# Patient Record
Sex: Male | Born: 1995 | Race: White | Hispanic: No | Marital: Single | Smoking: Former smoker
Health system: Southern US, Community
[De-identification: ages and names within clinical notes are randomized; demographics above are authoritative.]

## PROBLEM LIST (undated history)

## (undated) ENCOUNTER — Emergency Department (HOSPITAL_COMMUNITY): Discharge status: Absent without leave | Payer: BC Managed Care – PPO

## (undated) DIAGNOSIS — K219 Gastro-esophageal reflux disease without esophagitis: Secondary | ICD-10-CM

## (undated) DIAGNOSIS — L718 Other rosacea: Secondary | ICD-10-CM

## (undated) HISTORY — DX: Other rosacea: L71.8

## (undated) HISTORY — PX: TONSILLECTOMY AND ADENOIDECTOMY: SUR1326

## (undated) HISTORY — DX: Gastro-esophageal reflux disease without esophagitis: K21.9

---
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
## 2013-09-25 ENCOUNTER — Ambulatory Visit: Payer: BC Managed Care – PPO | Admitting: Dietician

## 2013-11-05 ENCOUNTER — Encounter: Payer: Self-pay | Admitting: Dietician

## 2013-11-05 ENCOUNTER — Encounter: Payer: BC Managed Care – PPO | Attending: Internal Medicine | Admitting: Dietician

## 2013-11-05 VITALS — Ht 70.0 in | Wt 180.0 lb

## 2013-11-05 DIAGNOSIS — Z713 Dietary counseling and surveillance: Secondary | ICD-10-CM | POA: Insufficient documentation

## 2013-11-05 DIAGNOSIS — R51 Headache: Secondary | ICD-10-CM | POA: Insufficient documentation

## 2013-11-05 NOTE — Progress Notes (Signed)
Medical Nutrition Therapy:  Appt start time: 1600 end time:  1700.  Assessment:  Primary concerns today: Pt currently taking a variety of supplements, interested in bodybuilding, wants info on best weight gain practices. Pt also c/o frequent, severe headache during exercise.  Preferred Learning Style:   No preference indicated   Learning Readiness:   Ready  MEDICATIONS: see list  Pt is currently taking 6 supplements of note: Creatine Protein powder (whey) Blood vessel expander (a combination of arginine, ornithine, citrulline, and beta-alanine) A "testosterone precursor" A "post-cycle therapy" Pre-workout energy drinks  Progress Towards Goal(s):  In progress.   Nutritional Diagnosis:  NB-1.1 Food and nutrition-related knowledge deficit As related to supplement use and industry, effective weight gain protocols.  As evidenced by pt use of multiple supplements with limited efficacy and possible safety concerns, pt questions during session about weight gain diet.    Intervention:  Nutrition counseling regarding safe supplement practice and those with efficacy for performance in the sport of bodybuilding/weight lifting, also effective weight gain diet.   RD recommended pt discontinue the blood vessel expander, hormone precursor, and post cycle therapy supplements due to lack of evidence for efficacy as well as safety concerns. RD recommended use of creatine from the the approved supplement list at 5 g per day, branched chain amino acids at 4-5 g BID from approved supplements list, and protein powder from the approved supplements list as necessary/desired to fit dietary needs. RD recommended 3300 kcal per day diet for weight gain pace of about 1 pound per week, with approximately 1 g protein per pound body weight.  To treat headache, RD recommended pt discontinue use of the valsalva maneuver during weight training (holding breath throughout during of lift), as well as discontinue any pre  workout supplement with caffeine or amphetamine derivatives as both of these will substantially increase intracranial blood pressure. Also, RD recommended Gatorade as a drink option during workouts to prevent low BG and possible mild to moderate hyponatremia. If headaches persist, RD recommended pt see a physician immediately.   Teaching Method Utilized:  Visual Auditory  Handouts given during visit include:  The Athlete's Plate  Supplement Recommendations  Barriers to learning/adherence to lifestyle change: low knowledge level related to bodybuilding culture, supplements  Demonstrated degree of understanding via:  Teach Back   Monitoring/Evaluation:  Dietary intake, exercise, supplement use, and body weight. F/U PRN.

## 2013-11-06 ENCOUNTER — Ambulatory Visit: Payer: BC Managed Care – PPO | Admitting: Dietician

## 2013-11-20 ENCOUNTER — Ambulatory Visit: Payer: BC Managed Care – PPO | Admitting: Dietician

## 2013-11-27 ENCOUNTER — Ambulatory Visit: Payer: BC Managed Care – PPO | Admitting: Dietician

## 2014-05-21 ENCOUNTER — Emergency Department (HOSPITAL_COMMUNITY)
Admission: EM | Admit: 2014-05-21 | Discharge: 2014-05-21 | Disposition: A | Discharge status: Home / Self Care | Payer: BC Managed Care – PPO | Attending: Emergency Medicine | Admitting: Emergency Medicine

## 2014-05-21 ENCOUNTER — Encounter (HOSPITAL_COMMUNITY): Payer: Self-pay | Admitting: *Deleted

## 2014-05-21 DIAGNOSIS — R112 Nausea with vomiting, unspecified: Secondary | ICD-10-CM | POA: Diagnosis present

## 2014-05-21 DIAGNOSIS — Z79899 Other long term (current) drug therapy: Secondary | ICD-10-CM | POA: Insufficient documentation

## 2014-05-21 DIAGNOSIS — R1013 Epigastric pain: Secondary | ICD-10-CM | POA: Insufficient documentation

## 2014-05-21 LAB — COMPREHENSIVE METABOLIC PANEL
ALBUMIN: 4 g/dL (ref 3.5–5.2)
ALK PHOS: 61 U/L (ref 39–117)
ALT: 52 U/L (ref 0–53)
ANION GAP: 13 (ref 5–15)
AST: 98 U/L — ABNORMAL HIGH (ref 0–37)
BILIRUBIN TOTAL: 1 mg/dL (ref 0.3–1.2)
BUN: 15 mg/dL (ref 6–23)
CHLORIDE: 98 meq/L (ref 96–112)
CO2: 28 mEq/L (ref 19–32)
CREATININE: 0.85 mg/dL (ref 0.50–1.35)
Calcium: 9.7 mg/dL (ref 8.4–10.5)
GLUCOSE: 85 mg/dL (ref 70–99)
Potassium: 4.6 mEq/L (ref 3.7–5.3)
Sodium: 139 mEq/L (ref 137–147)
Total Protein: 6.9 g/dL (ref 6.0–8.3)

## 2014-05-21 LAB — CBC WITH DIFFERENTIAL/PLATELET
BASOS PCT: 0 % (ref 0–1)
Basophils Absolute: 0 10*3/uL (ref 0.0–0.1)
Eosinophils Absolute: 0.1 10*3/uL (ref 0.0–0.7)
Eosinophils Relative: 2 % (ref 0–5)
HEMATOCRIT: 43.5 % (ref 39.0–52.0)
HEMOGLOBIN: 14.6 g/dL (ref 13.0–17.0)
LYMPHS ABS: 1.2 10*3/uL (ref 0.7–4.0)
Lymphocytes Relative: 17 % (ref 12–46)
MCH: 31.1 pg (ref 26.0–34.0)
MCHC: 33.6 g/dL (ref 30.0–36.0)
MCV: 92.6 fL (ref 78.0–100.0)
MONO ABS: 0.5 10*3/uL (ref 0.1–1.0)
MONOS PCT: 8 % (ref 3–12)
NEUTROS ABS: 5.1 10*3/uL (ref 1.7–7.7)
Neutrophils Relative %: 73 % (ref 43–77)
Platelets: 300 10*3/uL (ref 150–400)
RBC: 4.7 MIL/uL (ref 4.22–5.81)
RDW: 13.9 % (ref 11.5–15.5)
WBC: 7 10*3/uL (ref 4.0–10.5)

## 2014-05-21 LAB — TYPE AND SCREEN
ABO/RH(D): A POS
Antibody Screen: NEGATIVE

## 2014-05-21 LAB — ABO/RH: ABO/RH(D): A POS

## 2014-05-21 MED ORDER — SUCRALFATE 1 G PO TABS: 1.0000 g | ORAL_TABLET | Freq: Three times a day (TID) | ORAL | Status: DC

## 2014-05-21 MED ORDER — ONDANSETRON 4 MG PO TBDP: 4.0000 mg | ORAL_TABLET | Freq: Three times a day (TID) | ORAL | Status: DC | PRN

## 2014-05-21 MED ORDER — OMEPRAZOLE 40 MG PO CPDR: 40.0000 mg | DELAYED_RELEASE_CAPSULE | Freq: Every day | ORAL | Status: DC

## 2014-05-21 NOTE — ED Provider Notes (Signed)
TIME SEEN: 6:13 PM  CHIEF COMPLAINT: epigastric pain, vomiting  HPI: Pt is a 18 y.o. M with no significant past medical history who presents to the emergency department with several days of intermittent epigastric pain and one episode of vomiting this morning that he reports was dark and looked like "peanut butter and a strawberry jelly". He states he was unsure if there is blood in his vomit or not. He has not had any vomiting since being in the ER for the past 5 hours. No diarrhea. No hematochezia or melena. No prior history of peptic ulcer, gastritis, GERD. No history of heavy NSAID use but does take NSAIDs occasionally for headaches. No history of alcohol use. 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 He reports the patient has seen a nutritionist who has told him the supplements were safe. Patient denies fevers, chills, current abdominal pain, dysuria or hematuria. Not on anticoagulation.  ROS: See HPI Constitutional: no fever  Eyes: no drainage  ENT: no runny nose   Cardiovascular:  no chest pain  Resp: no SOB  GI:  vomiting GU: no dysuria Integumentary: no rash  Allergy: no hives  Musculoskeletal: no leg swelling  Neurological: no slurred speech ROS otherwise negative  PAST MEDICAL HISTORY/PAST SURGICAL HISTORY:  History reviewed. No pertinent past medical history.  MEDICATIONS:  Prior to Admission medications   Medication Sig Start Date End Date Taking? Authorizing Provider  CREATINE PO Take 1 Dose by mouth daily.   Yes Historical Provider, MD  hydrocortisone cream 1 % Apply 1 application topically 2 (two) times daily.   Yes Historical Provider, MD  ibuprofen (ADVIL,MOTRIN) 200 MG tablet Take 800 mg by mouth every 6 (six) hours as needed for headache, mild pain or moderate pain.   Yes Historical Provider, MD  loratadine-pseudoephedrine (CLARITIN-D 24-HOUR) 10-240 MG per 24 hr tablet Take 1 tablet by  mouth daily.   Yes Historical Provider, MD  milk thistle 175 MG tablet Take 175 mg by mouth daily.   Yes Historical Provider, MD  Multiple Vitamins-Minerals (MULTIVITAMIN WITH MINERALS) tablet Take 1 tablet by mouth daily.   Yes Historical Provider, MD  OVER THE COUNTER MEDICATION Take 1 Dose by mouth daily. pre workout supplement   Yes Historical Provider, MD  WHEY PROTEIN PO Take 1 Dose by mouth daily.   Yes Historical Provider, MD    ALLERGIES:  No Known Allergies  SOCIAL HISTORY:  History  Substance Use Topics  . Smoking status: Current Every Day Smoker  . Smokeless tobacco: Not on file  . Alcohol Use: No    FAMILY HISTORY: No family history on file.  EXAM: BP 129/55 mmHg  Pulse 83  Temp(Src) 98 F (36.7 C) (Oral)  Resp 20  SpO2 99% CONSTITUTIONAL: Alert and oriented and responds appropriately to questions. Well-appearing; well-nourished; in NAD; nontoxic, smiling and laughing HEAD: Normocephalic EYES: Conjunctivae clear, PERRL ENT: normal nose; no rhinorrhea; moist mucous membranes; pharynx without lesions noted NECK: Supple, no meningismus, no LAD  CARD: RRR; S1 and S2 appreciated; no murmurs, no clicks, no rubs, no gallops RESP: Normal chest excursion without splinting or tachypnea; breath sounds clear and equal bilaterally; no wheezes, no rhonchi, no rales,  ABD/GI: Normal bowel sounds; non-distended; soft, mildly tender to palpation in the epigastric region without guarding or rebound, negative Murphy sign, no tenderness at McBurney's point, no peritoneal signs BACK:  The back appears normal and is non-tender to palpation, there is  no CVA tenderness EXT: Normal ROM in all joints; non-tender to palpation; no edema; normal capillary refill; no cyanosis    SKIN: Normal color for age and race; warm NEURO: Moves all extremities equally PSYCH: The patient's mood and manner are appropriate. Grooming and personal hygiene are appropriate.  MEDICAL DECISION MAKING: Pt here  with epigastric pain and one episode of vomiting today that he thinks may have had blood in it. He has been in the waiting room for approximately 5 hours and has not had further vomiting. He does have some mild epigastric tenderness on exam discussed with patient he may have GERD versus gastritis versus peptic ulcer disease. Doubt a perforated ulcer given his abdominal exam is so benign and he has no peritoneal signs and is very well-appearing. His hemoglobin today is normal. He reports he was told by some in the emergency department today that he had "something wrong with his liver". He does have a mildly elevated AST but this is isolated in his other LFTs are normal. He has a negative Murphy sign. Discussed with patient that I feel he is safe to be discharged home and that he may need to follow-up with his gastroenterologist if symptoms continue. We'll discharge with omeprazole, Carafate and Zofran. I do not feel he needs further imaging at this time and given he is not having hematemesis, hematochezia or melena and he does not need an emergent GI consult. Patient and father are comfortable with this plan. Discussed return precautions. We'll discharge home. He has a PCP for follow-up.   Layla MawKristen N Antionio Negron, DO 05/21/14 1921

## 2014-05-21 NOTE — ED Notes (Addendum)
Pt states headache and vomiting last night.  He woke up went to school.  When he returned home he vomited again, but this time it looked like "coffee grounds and strawberry jelly".  Dr Timothy Lassousso told mother to bring pt here.  Presently pt c/o some occipital pain and some nausea.  States ate [employer] yesterday. Pt states Dr Timothy Lassousso said "something wrong with my liver".  Mother is concerned b/c he takes a lot of supplements.

## 2014-05-21 NOTE — Discharge Instructions (Signed)
Nausea and Vomiting Nausea is a sick feeling that often comes before throwing up (vomiting). Vomiting is a reflex where stomach contents come out of your mouth. Vomiting can cause severe loss of body fluids (dehydration). Children and elderly adults can become dehydrated quickly, especially if they also have diarrhea. Nausea and vomiting are symptoms of a condition or disease. It is important to find the cause of your symptoms. CAUSES   Direct irritation of the stomach lining. This irritation can result from increased acid production (gastroesophageal reflux disease), infection, food poisoning, taking certain medicines (such as nonsteroidal anti-inflammatory drugs), alcohol use, or tobacco use.  Signals from the brain.These signals could be caused by a headache, heat exposure, an inner ear disturbance, increased pressure in the brain from injury, infection, a tumor, or a concussion, pain, emotional stimulus, or metabolic problems.  An obstruction in the gastrointestinal tract (bowel obstruction).  Illnesses such as diabetes, hepatitis, gallbladder problems, appendicitis, kidney problems, cancer, sepsis, atypical symptoms of a heart attack, or eating disorders.  Medical treatments such as chemotherapy and radiation.  Receiving medicine that makes you sleep (general anesthetic) during surgery. DIAGNOSIS Your caregiver may ask for tests to be done if the problems do not improve after a few days. Tests may also be done if symptoms are severe or if the reason for the nausea and vomiting is not clear. Tests may include:  Urine tests.  Blood tests.  Stool tests.  Cultures (to look for evidence of infection).  X-rays or other imaging studies. Test results can help your caregiver make decisions about treatment or the need for additional tests. TREATMENT You need to stay well hydrated. Drink frequently but in small amounts.You may wish to drink water, sports drinks, clear broth, or eat frozen  ice pops or gelatin dessert to help stay hydrated.When you eat, eating slowly may help prevent nausea.There are also some antinausea medicines that may help prevent nausea. HOME CARE INSTRUCTIONS   Take all medicine as directed by your caregiver.  If you do not have an appetite, do not force yourself to eat. However, you must continue to drink fluids.  If you have an appetite, eat a normal diet unless your caregiver tells you differently.  Eat a variety of complex carbohydrates (rice, wheat, potatoes, bread), lean meats, yogurt, fruits, and vegetables.  Avoid high-fat foods because they are more difficult to digest.  Drink enough water and fluids to keep your urine clear or pale yellow.  If you are dehydrated, ask your caregiver for specific rehydration instructions. Signs of dehydration may include:  Severe thirst.  Dry lips and mouth.  Dizziness.  Dark urine.  Decreasing urine frequency and amount.  Confusion.  Rapid breathing or pulse. SEEK IMMEDIATE MEDICAL CARE IF:   You have blood or brown flecks (like coffee grounds) in your vomit.  You have black or bloody stools.  You have a severe headache or stiff neck.  You are confused.  You have severe abdominal pain.  You have chest pain or trouble breathing.  You do not urinate at least once every 8 hours.  You develop cold or clammy skin.  You continue to vomit for longer than 24 to 48 hours.  You have a fever. MAKE SURE YOU:   Understand these instructions.  Will watch your condition.  Will get help right away if you are not doing well or get worse. Document Released: 06/19/2005 Document Revised: 09/11/2011 Document Reviewed: 11/16/2010 ExitCare Patient Information 2015 [employer]. This information is not intended   to replace advice given to you by your health care provider. Make sure you discuss any questions you have with your health care provider.   Possible Gastritis, Adult Gastritis is  soreness and swelling (inflammation) of the lining of the stomach. Gastritis can develop as a sudden onset (acute) or long-term (chronic) condition. If gastritis is not treated, it can lead to stomach bleeding and ulcers. CAUSES  Gastritis occurs when the stomach lining is weak or damaged. Digestive juices from the stomach then inflame the weakened stomach lining. The stomach lining may be weak or damaged due to viral or bacterial infections. One common bacterial infection is the Helicobacter pylori infection. Gastritis can also result from excessive alcohol consumption, taking certain medicines, or having too much acid in the stomach.  SYMPTOMS  In some cases, there are no symptoms. When symptoms are present, they may include:  Pain or a burning sensation in the upper abdomen.  Nausea.  Vomiting.  An uncomfortable feeling of fullness after eating. DIAGNOSIS  Your caregiver may suspect you have gastritis based on your symptoms and a physical exam. To determine the cause of your gastritis, your caregiver may perform the following:  Blood or stool tests to check for the H pylori bacterium.  Gastroscopy. A thin, flexible tube (endoscope) is passed down the esophagus and into the stomach. The endoscope has a light and camera on the end. Your caregiver uses the endoscope to view the inside of the stomach.  Taking a tissue sample (biopsy) from the stomach to examine under a microscope. TREATMENT  Depending on the cause of your gastritis, medicines may be prescribed. If you have a bacterial infection, such as an H pylori infection, antibiotics may be given. If your gastritis is caused by too much acid in the stomach, H2 blockers or antacids may be given. Your caregiver may recommend that you stop taking aspirin, ibuprofen, or other nonsteroidal anti-inflammatory drugs (NSAIDs). HOME CARE INSTRUCTIONS  Only take over-the-counter or prescription medicines as directed by your caregiver.  If you were  given antibiotic medicines, take them as directed. Finish them even if you start to feel better.  Drink enough fluids to keep your urine clear or pale yellow.  Avoid foods and drinks that make your symptoms worse, such as:  Caffeine or alcoholic drinks.  Chocolate.  Peppermint or mint flavorings.  Garlic and onions.  Spicy foods.  Citrus fruits, such as oranges, lemons, or limes.  Tomato-based foods such as sauce, chili, salsa, and pizza.  Fried and fatty foods.  Eat small, frequent meals instead of large meals. SEEK IMMEDIATE MEDICAL CARE IF:   You have black or dark red stools.  You vomit blood or material that looks like coffee grounds.  You are unable to keep fluids down.  Your abdominal pain gets worse.  You have a fever.  You do not feel better after 1 week.  You have any other questions or concerns. MAKE SURE YOU:  Understand these instructions.  Will watch your condition.  Will get help right away if you are not doing well or get worse. Document Released: 06/13/2001 Document Revised: 12/19/2011 Document Reviewed: 08/02/2011 [facility] Patient Information 2015 [place], [place]. This information is not intended to replace advice given to you by your health care provider. Make sure you discuss any questions you have with your health care provider.   Possible Peptic Ulcer A peptic ulcer is a sore in the lining of your esophagus (esophageal ulcer), stomach (gastric ulcer), or in the first part  of your small intestine (duodenal ulcer). The ulcer causes erosion into the deeper tissue. CAUSES  Normally, the lining of the stomach and the small intestine protects itself from the acid that digests food. The protective lining can be damaged by:  An infection caused by a bacterium called Helicobacter pylori (H. pylori).  Regular use of nonsteroidal anti-inflammatory drugs (NSAIDs), such as ibuprofen or aspirin.  Smoking tobacco. Other risk factors include being  older than 50, drinking alcohol excessively, and having a family history of ulcer disease.  SYMPTOMS   Burning pain or gnawing in the area between the chest and the belly button.  Heartburn.  Nausea and vomiting.  Bloating. The pain can be worse on an empty stomach and at night. If the ulcer results in bleeding, it can cause:  Black, tarry stools.  Vomiting of bright red blood.  Vomiting of coffee-ground-looking materials. DIAGNOSIS  A diagnosis is usually made based upon your history and an exam. Other tests and procedures may be performed to find the cause of the ulcer. Finding a cause will help determine the best treatment. Tests and procedures may include:  Blood tests, stool tests, or breath tests to check for the bacterium H. pylori.  An upper gastrointestinal (GI) series of the esophagus, stomach, and small intestine.  An endoscopy to examine the esophagus, stomach, and small intestine.  A biopsy. TREATMENT  Treatment may include:  Eliminating the cause of the ulcer, such as smoking, NSAIDs, or alcohol.  Medicines to reduce the amount of acid in your digestive tract.  Antibiotic medicines if the ulcer is caused by the H. pylori bacterium.  An upper endoscopy to treat a bleeding ulcer.  Surgery if the bleeding is severe or if the ulcer created a hole somewhere in the digestive system. HOME CARE INSTRUCTIONS   Avoid tobacco, alcohol, and caffeine. Smoking can increase the acid in the stomach, and continued smoking will impair the healing of ulcers.  Avoid foods and drinks that seem to cause discomfort or aggravate your ulcer.  Only take medicines as directed by your caregiver. Do not substitute over-the-counter medicines for prescription medicines without talking to your caregiver.  Keep any follow-up appointments and tests as directed. SEEK MEDICAL CARE IF:   Your do not improve within 7 days of starting treatment.  You have ongoing indigestion or  heartburn. SEEK IMMEDIATE MEDICAL CARE IF:   You have sudden, sharp, or persistent abdominal pain.  You have bloody or dark black, tarry stools.  You vomit blood or vomit that looks like coffee grounds.  You become light-headed, weak, or feel faint.  You become sweaty or clammy. MAKE SURE YOU:   Understand these instructions.  Will watch your condition.  Will get help right away if you are not doing well or get worse. Document Released: 06/16/2000 Document Revised: 11/03/2013 Document Reviewed: 01/17/2012 [facility] Patient Information 2015 [place], [place]. This information is not intended to replace advice given to you by your health care provider. Make sure you discuss any questions you have with your health care provider.   Food Choices for Gastroesophageal Reflux Disease When you have gastroesophageal reflux disease (GERD), the foods you eat and your eating habits are very important. Choosing the right foods can help ease the discomfort of GERD. WHAT GENERAL GUIDELINES DO I NEED TO FOLLOW?  Choose fruits, vegetables, whole grains, low-fat dairy products, and low-fat meat, fish, and poultry.  Limit fats such as oils, salad dressings, butter, nuts, and avocado.  Keep a food diary  to identify foods that cause symptoms.  Avoid foods that cause reflux. These may be different for different people.  Eat frequent small meals instead of three large meals each day.  Eat your meals slowly, in a relaxed setting.  Limit fried foods.  Cook foods using methods other than frying.  Avoid drinking alcohol.  Avoid drinking large amounts of liquids with your meals.  Avoid bending over or lying down until 2-3 hours after eating. WHAT FOODS ARE NOT RECOMMENDED? The following are some foods and drinks that may worsen your symptoms: Vegetables Tomatoes. Tomato juice. Tomato and spaghetti sauce. Chili peppers. Onion and garlic. Horseradish. Fruits Oranges, grapefruit, and lemon (fruit  and juice). Meats High-fat meats, fish, and poultry. This includes hot dogs, ribs, ham, sausage, salami, and bacon. Dairy Whole milk and chocolate milk. Sour cream. Cream. Butter. Ice cream. Cream cheese.  Beverages Coffee and tea, with or without caffeine. Carbonated beverages or energy drinks. Condiments Hot sauce. Barbecue sauce.  Sweets/Desserts Chocolate and cocoa. Donuts. Peppermint and spearmint. Fats and Oils High-fat foods, including [place] fries and potato chips. Other Vinegar. Strong spices, such as black pepper, white pepper, red pepper, cayenne, curry powder, cloves, ginger, and chili powder. The items listed above may not be a complete list of foods and beverages to avoid. Contact your dietitian for more information. Document Released: 06/19/2005 Document Revised: 06/24/2013 Document Reviewed: 04/23/2013 [facility] Patient Information 2015 [place], [place]. This information is not intended to replace advice given to you by your health care provider. Make sure you discuss any questions you have with your health care provider.

## 2015-04-08 ENCOUNTER — Telehealth: Payer: Self-pay

## 2015-04-08 NOTE — Telephone Encounter (Signed)
Patient's father is trying schedule for a Dr. Merla Riches appointment. He states that he was Referred by by Dr. Iva Boop for ADD issues. Patient will be in [facility] tomorrow 10/7 and patient's father would like for him to possible come in tomorrow. I informed him that it's not a garuntee. Please call Molli Hazard! [phone]

## 2016-08-01 ENCOUNTER — Ambulatory Visit: Payer: Self-pay

## 2016-08-29 ENCOUNTER — Encounter: Payer: Self-pay | Admitting: Nurse Practitioner

## 2016-09-11 ENCOUNTER — Ambulatory Visit: Payer: Self-pay | Admitting: Nurse Practitioner

## 2016-09-19 ENCOUNTER — Ambulatory Visit: Payer: Self-pay | Admitting: Physician Assistant

## 2016-09-25 ENCOUNTER — Ambulatory Visit (INDEPENDENT_AMBULATORY_CARE_PROVIDER_SITE_OTHER): Payer: BLUE CROSS/BLUE SHIELD | Admitting: Physician Assistant

## 2016-09-25 ENCOUNTER — Encounter (INDEPENDENT_AMBULATORY_CARE_PROVIDER_SITE_OTHER): Payer: Self-pay

## 2016-09-25 ENCOUNTER — Encounter: Payer: Self-pay | Admitting: Physician Assistant

## 2016-09-25 VITALS — BP 92/60 | HR 70 | Ht 70.0 in | Wt 152.0 lb

## 2016-09-25 DIAGNOSIS — R1084 Generalized abdominal pain: Secondary | ICD-10-CM

## 2016-09-25 DIAGNOSIS — K921 Melena: Secondary | ICD-10-CM | POA: Diagnosis not present

## 2016-09-25 DIAGNOSIS — R197 Diarrhea, unspecified: Secondary | ICD-10-CM | POA: Diagnosis not present

## 2016-09-25 NOTE — Patient Instructions (Signed)
It has been recommended to you by your physician that you have a(n) colonoscopy completed. Per your request, we did not schedule the procedure(s) today. Please contact our office at [phone] should you decide to have the procedure completed. 

## 2016-09-25 NOTE — Progress Notes (Signed)
Agree with initial assessment and plans 

## 2016-09-25 NOTE — Progress Notes (Addendum)
Chief Complaint: Hematochezia, Diarrhea, Abdominal Pain  HPI:  Mr. Chase Mcneil is a 21 year old Caucasian male who was referred to me by Creola Corn, MD for a complaint of hematochezia, abdominal pain and diarrhea .     The patient presents to clinic accompanied by his father who does minimally assist with his history. It should be noted that he is a very poor historian. The patient tells me that he started with stomach problems about 2 years ago when he was "vomiting coffee grounds", apparently he was thoroughly checked out with labs and imaging and they told him that he was "fine", he then had repeat symptoms while in [place] that summer", apparently they told him he was bleeding internally and he had an episode of syncope, but they also told him that he was "normal" after labs and imaging, no history of previous colonoscopy or endoscopy.   Today, the patient tells me that for the past 6 months or more he has been having bright red blood mixed in with his bowel movements every other time that he has a bowel movement. He tells me that he has typically diarrhea at least 6-8 times a day. This is worsened after eating. He tells me that very rarely maybe once a month he will have a more solid stool. Associated symptoms include abdominal pain which is generalized but sometimes feels worse in left lower quadrant unrelated to a bowel movement. Apparently the patient tells me that he has lost a total of 30 pounds over the past 2 years, though no drastic weight loss recently. He tells me that he is "unable to put on weight". 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   Patient's social history is positive for occasional marijuana use.   Patient denies any current epigastric pain, nausea, vomiting, heartburn or reflux, symptoms that awaken him at night, excess gas or bloating, fever, chills or anorexia.  Past Medical History:    Diagnosis Date  . Rosacea keratitis     Past Surgical History:  Procedure Laterality Date  . TONSILLECTOMY AND ADENOIDECTOMY      No current outpatient prescriptions on file.   No current facility-administered medications for this visit.     Allergies as of 09/25/2016  . (No Known Allergies)    Family History  Problem Relation Age of Onset  . Liver cancer Father   . Stomach cancer Neg Hx   . Colon cancer Neg Hx     Social History   Social History  . Marital status: Single    Spouse name: N/A  . Number of children: N/A  . Years of education: N/A   Occupational History  . Not on file.   Social History Main Topics  . Smoking status: Former Smoker    Types: E-cigarettes  . Smokeless tobacco: Never Used  . Alcohol use Yes     Comment: occ   . Drug use: Yes    Types: Marijuana  . Sexual activity: Yes   Other Topics Concern  . Not on file   Social History Narrative  . No narrative on file    Review of Systems:    Constitutional: No fever or chills Skin: No rash Cardiovascular: No chest pain Respiratory: No SOB Gastrointestinal: See HPI and otherwise negative Genitourinary: No dysuria or change in urinary frequency Neurological: No headache Musculoskeletal: No new muscle or joint pain Hematologic: No  bruising Psychiatric: No history of depression or anxiety   Physical Exam:  Vital signs: BP 92/60   Pulse 70   Ht 5\' 10"  (1.778 m)   Wt 152 lb (68.9 kg)   BMI 21.81 kg/m   Constitutional: Young Caucasian male appears to be in NAD, Well developed, Well nourished, alert and cooperative Head:  Normocephalic and atraumatic. Eyes:   PEERL, EOMI. No icterus. Conjunctiva pink. Ears:  Normal auditory acuity. Neck:  Supple Throat: Oral cavity and pharynx without inflammation, swelling or lesion.  Respiratory: Respirations even and unlabored. Lungs clear to auscultation bilaterally.   No wheezes, crackles, or rhonchi.  Cardiovascular: Normal S1, S2. No  MRG. Regular rate and rhythm. No peripheral edema, cyanosis or pallor.  Gastrointestinal:  Soft, nondistended,mild generalized ttp. No rebound or guarding. Normal bowel sounds. No appreciable masses or hepatomegaly. Rectal:  Not performed.  Msk:  Symmetrical without gross deformities. Without edema, no deformity or joint abnormality.  Neurologic:  Alert and  oriented x4;  grossly normal neurologically.  Skin:   Dry and intact without significant lesions or rashes. Psychiatric:  Demonstrates good judgement and reason without abnormal affect or behaviors.  Requesting recent labs.  Assessment: 1. Hematochezia: Patient reports recent rectal exam by his PCP who told him it could be "hemorrhoids", but "could be something else", we do not have report today, patient also reports recent labs which were "normal", requesting all records today, history of hematochezia with every other bowel movement over the past at least 6 months per the patient with abdominal pain and diarrhea; consider IBD very likely versus hemorrhoids versus other 2. Abdominal pain: Generalized abdominal pain worse on some days and better on others, patient is unable to relate this to anything, typically in lower abdomen;Consider relation to above  3. Diarrhea:Typically 6-8 loose stools on daily basis, with all the above concern for IBD  Plan: 1. [facility] patient [facility] Did discuss risks, benefits, limitations and alternatives the patient agrees to proceed. He would like to have a 7:30 or 4:00 appointment on a Monday if possible. Will discuss with Dr. Marina GoodellPerry to see if there is availability for this. Per his father he is already "failing" in school and he would prefer he not miss any more classes. 2. Discussed an endoscopy today, but per the patient's history and confirmation from his father he has not had epigastric pain, heartburn, reflux or upper GI symptoms within the past 2 years, doing  an endoscopy now would likely not be beneficial. 3. Patient to return to clinic per Dr. Lamar SprinklesPerry's recommendations after time of colonoscopy.  Hyacinth MeekerJennifer Landry Lookingbill, PA-C [facility] 09/25/2016, 2:50 PM  Cc: Creola Cornusso, John, MD   Addendum: 09/26/16  1106 JLL  Labs completed 08/15/16 show a normal CMP and CBC.  Rectal exam was noted at time of patient's last PCP appointment on 09/26/16 with no masses or tenderness  Hyacinth MeekerJennifer Tiffannie Sloss, PA-C [phone]

## 2016-09-27 ENCOUNTER — Telehealth: Payer: Self-pay | Admitting: Internal Medicine

## 2016-09-28 ENCOUNTER — Other Ambulatory Visit: Payer: Self-pay | Admitting: Emergency Medicine

## 2016-09-28 DIAGNOSIS — K625 Hemorrhage of anus and rectum: Secondary | ICD-10-CM

## 2016-09-28 MED ORDER — NA SULFATE-K SULFATE-MG SULF 17.5-3.13-1.6 GM/177ML PO SOLN: 1.0000 | ORAL | 0 refills | Status: DC

## 2016-09-28 NOTE — Telephone Encounter (Signed)
Spoke to patients father Molli Hazard Larner) and he confirmed that patient can come on 10-09-16 for colonoscopy. He will try to coordinate with his son so he can come by office on Monday 10-02-16 for instructions. Rx sent to [facility] per parents request.

## 2016-10-02 ENCOUNTER — Encounter: Payer: Self-pay | Admitting: Internal Medicine

## 2016-10-09 ENCOUNTER — Ambulatory Visit (AMBULATORY_SURGERY_CENTER): Payer: BLUE CROSS/BLUE SHIELD | Admitting: Internal Medicine

## 2016-10-09 ENCOUNTER — Encounter: Payer: Self-pay | Admitting: Internal Medicine

## 2016-10-09 VITALS — BP 112/68 | HR 65 | Temp 97.1°F | Resp 12 | Ht 70.0 in | Wt 152.0 lb

## 2016-10-09 DIAGNOSIS — K921 Melena: Secondary | ICD-10-CM | POA: Diagnosis not present

## 2016-10-09 DIAGNOSIS — R197 Diarrhea, unspecified: Secondary | ICD-10-CM | POA: Diagnosis not present

## 2016-10-09 MED ORDER — SODIUM CHLORIDE 0.9 % IV SOLN: 500.0000 mL | INTRAVENOUS | Status: AC

## 2016-10-09 NOTE — Op Note (Signed)
[facility] Patient Name: Chase Mcneil Procedure Date: 10/09/2016 7:59 AM MRN: [id number] Endoscopist: Wilhemina Bonito. Marina Goodell , MD Age: 21 Referring MD:  Date of Birth: [birth year] Gender: Male Account #: [id number] Procedure:                Colonoscopy, with biopsies Indications:              Abdominal pain, Clinically significant diarrhea of                            unexplained origin, Rectal bleeding Medicines:                Monitored Anesthesia Care Procedure:                Pre-Anesthesia Assessment:                           - Prior to the procedure, a History and Physical                            was performed, and patient medications and                            allergies were reviewed. The patient's tolerance of                            previous anesthesia was also reviewed. The risks                            and benefits of the procedure and the sedation                            options and risks were discussed with the patient.                            All questions were answered, and informed consent                            was obtained. Prior Anticoagulants: The patient has                            taken no previous anticoagulant or antiplatelet                            agents. ASA Grade Assessment: I - A normal, healthy                            patient. After reviewing the risks and benefits,                            the patient was deemed in satisfactory condition to                            undergo the procedure.  After obtaining informed consent, the colonoscope                            was passed under direct vision. Throughout the                            procedure, the patient's blood pressure, pulse, and                            oxygen saturations were monitored continuously. The                            Colonoscope was introduced through the anus and                            advanced to the the cecum,  identified by                            appendiceal orifice and ileocecal valve. The                            terminal ileum, ileocecal valve, appendiceal                            orifice, and rectum were photographed. The quality                            of the bowel preparation was excellent. The                            colonoscopy was performed without difficulty. The                            patient tolerated the procedure well. The bowel                            preparation used was SUPREP. Scope In: 8:17:23 AM Scope Out: 8:28:53 AM Scope Withdrawal Time: 0 hours 8 minutes 47 seconds  Total Procedure Duration: 0 hours 11 minutes 30 seconds  Findings:                 The terminal ileum appeared normal.                           The entire examined colon appeared normal on direct                            and retroflexion views. Biopsies for histology were                            taken with a cold forceps from the entire colon for                            evaluation of microscopic colitis. Complications:  No immediate complications. Estimated blood loss:                            None. Estimated Blood Loss:     Estimated blood loss: none. Impression:               - The examined portion of the ileum was normal.                           - The entire examined colon is normal on direct and                            retroflexion views.                           - No evidence for inflammatory bowel disease.                            Biopsies taken to rule out microscopic colitis. May                            have irritable bowel syndrome Recommendation:           - Repeat colonoscopy at age 68 for screening                            purposes.                           - Patient has a contact number available for                            emergencies. The signs and symptoms of potential                            delayed complications were discussed  with the                            patient. Return to normal activities tomorrow.                            Written discharge instructions were provided to the                            patient.                           - Resume previous diet.                           - Begin fiber supplementation with Metamucil 2                            tablespoons daily. This should improve the  consistency of bowel habits.                           - Await pathology results. Dr. Marina Goodell will send you                            a letter with the results and additional                            recommendations if indicated                           - GI follow-up in the office in 6-8 weeks. Wilhemina Bonito. Marina Goodell, MD 10/09/2016 8:36:40 AM This report has been signed electronically.

## 2016-10-09 NOTE — Progress Notes (Signed)
Report to PACU, RN, vss, BBS= Clear.  

## 2016-10-09 NOTE — Patient Instructions (Signed)
Resume your diet.  Begin a fiber supplementation with Metamucil 2 tablespoons daily. This should improve the consistency of bowel habits.  GI follow up in 6-8 weeks.    YOU HAD AN ENDOSCOPIC PROCEDURE TODAY AT THE [facility]:   Refer to the procedure report that was given to you for any specific questions about what was found during the examination.  If the procedure report does not answer your questions, please call your gastroenterologist to clarify.  If you requested that your care partner not be given the details of your procedure findings, then the procedure report has been included in a sealed envelope for you to review at your convenience later.  YOU SHOULD EXPECT: Some feelings of bloating in the abdomen. Passage of more gas than usual.  Walking can help get rid of the air that was put into your GI tract during the procedure and reduce the bloating. If you had a lower endoscopy (such as a colonoscopy or flexible sigmoidoscopy) you may notice spotting of blood in your stool or on the toilet paper. If you underwent a bowel prep for your procedure, you may not have a normal bowel movement for a few days.  Please Note:  You might notice some irritation and congestion in your nose or some drainage.  This is from the oxygen used during your procedure.  There is no need for concern and it should clear up in a day or so.  SYMPTOMS TO REPORT IMMEDIATELY:   Following lower endoscopy (colonoscopy or flexible sigmoidoscopy):  Excessive amounts of blood in the stool  Significant tenderness or worsening of abdominal pains  Swelling of the abdomen that is new, acute  Fever of 100F or higher   For urgent or emergent issues, a gastroenterologist can be reached at any hour by calling [phone].   DIET:  We do recommend a small meal at first, but then you may proceed to your regular diet.  Drink plenty of fluids but you should avoid alcoholic beverages for 24 hours.  ACTIVITY:   You should plan to take it easy for the rest of today and you should NOT DRIVE or use heavy machinery until tomorrow (because of the sedation medicines used during the test).    FOLLOW UP: Our staff will call the number listed on your records the next business day following your procedure to check on you and address any questions or concerns that you may have regarding the information given to you following your procedure. If we do not reach you, we will leave a message.  However, if you are feeling well and you are not experiencing any problems, there is no need to return our call.  We will assume that you have returned to your regular daily activities without incident.  If any biopsies were taken you will be contacted by phone or by letter within the next 1-3 weeks.  Please call us at [phone] if you have not heard about the biopsies in 3 weeks.    SIGNATURES/CONFIDENTIALITY: You and/or your care partner have signed paperwork which will be entered into your electronic medical record.  These signatures attest to the fact that that the information above on your After Visit Summary has been reviewed and is understood.  Full responsibility of the confidentiality of this discharge information lies with you and/or your care-partner.

## 2016-10-09 NOTE — Progress Notes (Signed)
Called to room to assist during endoscopic procedure.  Patient ID and intended procedure confirmed with present staff. Received instructions for my participation in the procedure from the performing physician.  

## 2016-10-10 ENCOUNTER — Telehealth: Payer: Self-pay | Admitting: *Deleted

## 2016-10-10 NOTE — Telephone Encounter (Signed)
  Follow up Call-  Call back number 10/09/2016  Post procedure Call Back phone  # [phone]  Permission to leave phone message Yes  Some recent data might be hidden    [facility]

## 2016-10-10 NOTE — Telephone Encounter (Signed)
  Follow up Call-  Call back number 10/09/2016  Post procedure Call Back phone  # [phone]  Permission to leave phone message Yes  Some recent data might be hidden    [facility] 

## 2016-10-17 ENCOUNTER — Encounter: Payer: Self-pay | Admitting: Internal Medicine

## 2016-11-13 ENCOUNTER — Telehealth: Payer: Self-pay | Admitting: Internal Medicine

## 2016-11-13 ENCOUNTER — Ambulatory Visit: Payer: BLUE CROSS/BLUE SHIELD | Admitting: Internal Medicine

## 2016-11-13 NOTE — Telephone Encounter (Signed)
No charge. 

## 2016-11-21 ENCOUNTER — Ambulatory Visit: Payer: BLUE CROSS/BLUE SHIELD | Admitting: Internal Medicine

## 2017-11-29 DIAGNOSIS — H6123 Impacted cerumen, bilateral: Secondary | ICD-10-CM | POA: Diagnosis not present

## 2017-11-29 DIAGNOSIS — J018 Other acute sinusitis: Secondary | ICD-10-CM | POA: Diagnosis not present

## 2017-11-29 DIAGNOSIS — J310 Chronic rhinitis: Secondary | ICD-10-CM | POA: Diagnosis not present

## 2018-02-19 DIAGNOSIS — F419 Anxiety disorder, unspecified: Secondary | ICD-10-CM | POA: Diagnosis not present

## 2018-07-01 DIAGNOSIS — F411 Generalized anxiety disorder: Secondary | ICD-10-CM | POA: Diagnosis not present

## 2019-05-09 DIAGNOSIS — Z20828 Contact with and (suspected) exposure to other viral communicable diseases: Secondary | ICD-10-CM | POA: Diagnosis not present

## 2019-05-09 DIAGNOSIS — Z20818 Contact with and (suspected) exposure to other bacterial communicable diseases: Secondary | ICD-10-CM | POA: Diagnosis not present

## 2019-05-09 DIAGNOSIS — R0789 Other chest pain: Secondary | ICD-10-CM | POA: Diagnosis not present

## 2019-05-23 DIAGNOSIS — J309 Allergic rhinitis, unspecified: Secondary | ICD-10-CM | POA: Diagnosis not present

## 2019-05-23 DIAGNOSIS — R519 Headache, unspecified: Secondary | ICD-10-CM | POA: Diagnosis not present

## 2019-05-23 DIAGNOSIS — Z20818 Contact with and (suspected) exposure to other bacterial communicable diseases: Secondary | ICD-10-CM | POA: Diagnosis not present

## 2019-05-23 DIAGNOSIS — J3489 Other specified disorders of nose and nasal sinuses: Secondary | ICD-10-CM | POA: Diagnosis not present

## 2019-05-23 DIAGNOSIS — K219 Gastro-esophageal reflux disease without esophagitis: Secondary | ICD-10-CM | POA: Diagnosis not present

## 2019-05-23 DIAGNOSIS — K2900 Acute gastritis without bleeding: Secondary | ICD-10-CM | POA: Diagnosis not present

## 2019-06-16 DIAGNOSIS — R0789 Other chest pain: Secondary | ICD-10-CM | POA: Diagnosis not present

## 2019-06-16 DIAGNOSIS — F419 Anxiety disorder, unspecified: Secondary | ICD-10-CM | POA: Diagnosis not present

## 2019-06-16 DIAGNOSIS — Z79899 Other long term (current) drug therapy: Secondary | ICD-10-CM | POA: Diagnosis not present

## 2019-06-16 DIAGNOSIS — K1379 Other lesions of oral mucosa: Secondary | ICD-10-CM | POA: Diagnosis not present

## 2019-06-17 ENCOUNTER — Ambulatory Visit (INDEPENDENT_AMBULATORY_CARE_PROVIDER_SITE_OTHER): Payer: Self-pay | Admitting: Otolaryngology

## 2019-06-17 ENCOUNTER — Encounter (INDEPENDENT_AMBULATORY_CARE_PROVIDER_SITE_OTHER): Payer: Self-pay | Admitting: Otolaryngology

## 2019-06-17 ENCOUNTER — Other Ambulatory Visit: Payer: Self-pay

## 2019-06-17 ENCOUNTER — Telehealth: Payer: Self-pay | Admitting: *Deleted

## 2019-06-17 VITALS — Temp 98.1°F

## 2019-06-17 DIAGNOSIS — R079 Chest pain, unspecified: Secondary | ICD-10-CM

## 2019-06-17 DIAGNOSIS — H6123 Impacted cerumen, bilateral: Secondary | ICD-10-CM

## 2019-06-17 NOTE — Telephone Encounter (Signed)
-----   Message from Osvaldo Shipper, [place] sent at 06/16/2019  4:55 PM EST ----- Regarding: CTCA Score CT CA Score for Octaviano Batty Patient of Dr Shon Baton at [facility] / Please order under Dr Meda Coffee Dr Virgina Jock to call results to patient DX Chest Pain. Any Cardiologist to read   Thanks Erline Levine

## 2019-06-17 NOTE — Progress Notes (Signed)
HPI: Chase Mcneil is a 23 y.o. male who returns today for evaluation of cerumen buildup as well as recent sores in the right side of his mouth.  He had a couple sores earlier in the week that got better but then redeveloped some other source of the right upper gum region.  He presents today with his mother to have his ears cleaned..  Past Medical History:  Diagnosis Date  . Rosacea keratitis    Past Surgical History:  Procedure Laterality Date  . TONSILLECTOMY AND ADENOIDECTOMY     Social History   Socioeconomic History  . Marital status: Single    Spouse name: Not on file  . Number of children: Not on file  . Years of education: Not on file  . Highest education level: Not on file  Occupational History  . Not on file  Tobacco Use  . Smoking status: Never Smoker  . Smokeless tobacco: Never Used  Substance and Sexual Activity  . Alcohol use: Yes    Comment: occ   . Drug use: Yes    Types: Marijuana  . Sexual activity: Yes  Other Topics Concern  . Not on file  Social History Narrative  . Not on file   Social Determinants of Health   Financial Resource Strain:   . Difficulty of Paying Living Expenses: Not on file  Food Insecurity:   . Worried About Programme researcher, broadcasting/film/video in the Last Year: Not on file  . Ran Out of Food in the Last Year: Not on file  Transportation Needs:   . Lack of Transportation (Medical): Not on file  . Lack of Transportation (Non-Medical): Not on file  Physical Activity:   . Days of Exercise per Week: Not on file  . Minutes of Exercise per Session: Not on file  Stress:   . Feeling of Stress : Not on file  Social Connections:   . Frequency of Communication with Friends and Family: Not on file  . Frequency of Social Gatherings with Friends and Family: Not on file  . Attends Religious Services: Not on file  . Active Member of Clubs or Organizations: Not on file  . Attends Banker Meetings: Not on file  . Marital Status: Not on file    Family History  Problem Relation Age of Onset  . Liver cancer Father   . Stomach cancer Neg Hx   . Colon cancer Neg Hx    No Known Allergies Prior to Admission medications   Not on File     Positive ROS: Negative  All other systems have been reviewed and were otherwise negative with the exception of those mentioned in the HPI and as above.  Physical Exam: Constitutional: Alert, well-appearing, no acute distress Ears: External ears without lesions or tenderness. Ear canals with moderate cerumen buildup bilaterally., clear TMs.  Nasal: External nose without lesions. Septum midline. Clear nasal passages Oral: Lips and gums without lesions. Tongue and palate mucosa without lesions. Posterior oropharynx clear.  Patient has 2 small aphthous ulcers in the right upper buccal gingival groove region. Neck: No palpable adenopathy or masses Respiratory: Breathing comfortably  Skin: No facial/neck lesions or rash noted.  Cerumen impaction removal  Date/Time: 06/17/2019 5:05 PM Performed by: Drema Halon, MD Authorized by: Drema Halon, MD   Consent:    Consent obtained:  Verbal   Consent given by:  Patient   Risks discussed:  Pain and bleeding Procedure details:    Location:  L ear  and R ear   Procedure type: curette   Post-procedure details:    Inspection:  TM intact and canal normal   Hearing quality:  Improved   Patient tolerance of procedure:  Tolerated well, no immediate complications    Assessment: Aphthous ulcers Cerumen buildup  Plan: Suggested using Zilactin for the aphthous ulcers. He will follow-up as needed.   Radene Journey, MD

## 2019-06-18 ENCOUNTER — Other Ambulatory Visit: Payer: Self-pay

## 2019-06-18 ENCOUNTER — Ambulatory Visit (INDEPENDENT_AMBULATORY_CARE_PROVIDER_SITE_OTHER)
Admission: RE | Admit: 2019-06-18 | Discharge: 2019-06-18 | Disposition: A | Discharge status: Home / Self Care | Payer: Self-pay | Source: Ambulatory Visit | Attending: Cardiology | Admitting: Cardiology

## 2019-06-18 DIAGNOSIS — R079 Chest pain, unspecified: Secondary | ICD-10-CM

## 2019-06-25 DIAGNOSIS — J309 Allergic rhinitis, unspecified: Secondary | ICD-10-CM | POA: Diagnosis not present

## 2019-06-25 DIAGNOSIS — Z20818 Contact with and (suspected) exposure to other bacterial communicable diseases: Secondary | ICD-10-CM | POA: Diagnosis not present

## 2019-06-25 DIAGNOSIS — R05 Cough: Secondary | ICD-10-CM | POA: Diagnosis not present

## 2019-06-25 DIAGNOSIS — J3489 Other specified disorders of nose and nasal sinuses: Secondary | ICD-10-CM | POA: Diagnosis not present

## 2019-07-11 ENCOUNTER — Ambulatory Visit: Payer: Self-pay | Admitting: Cardiology

## 2019-10-04 DIAGNOSIS — J029 Acute pharyngitis, unspecified: Secondary | ICD-10-CM | POA: Diagnosis not present

## 2019-10-04 DIAGNOSIS — Z20822 Contact with and (suspected) exposure to covid-19: Secondary | ICD-10-CM | POA: Diagnosis not present

## 2019-10-07 DIAGNOSIS — Z7251 High risk heterosexual behavior: Secondary | ICD-10-CM | POA: Diagnosis not present

## 2019-10-07 DIAGNOSIS — B009 Herpesviral infection, unspecified: Secondary | ICD-10-CM | POA: Diagnosis not present

## 2019-10-07 DIAGNOSIS — E049 Nontoxic goiter, unspecified: Secondary | ICD-10-CM | POA: Diagnosis not present

## 2019-10-08 ENCOUNTER — Other Ambulatory Visit: Payer: Self-pay | Admitting: Internal Medicine

## 2019-10-08 DIAGNOSIS — E049 Nontoxic goiter, unspecified: Secondary | ICD-10-CM

## 2019-10-09 ENCOUNTER — Ambulatory Visit
Admission: RE | Admit: 2019-10-09 | Discharge: 2019-10-09 | Disposition: A | Discharge status: Home / Self Care | Payer: Self-pay | Source: Ambulatory Visit | Attending: Internal Medicine | Admitting: Internal Medicine

## 2019-10-09 DIAGNOSIS — E049 Nontoxic goiter, unspecified: Secondary | ICD-10-CM

## 2019-10-13 ENCOUNTER — Other Ambulatory Visit: Payer: Self-pay | Admitting: Internal Medicine

## 2019-10-13 DIAGNOSIS — G4452 New daily persistent headache (NDPH): Secondary | ICD-10-CM | POA: Diagnosis not present

## 2019-10-13 DIAGNOSIS — R0789 Other chest pain: Secondary | ICD-10-CM | POA: Diagnosis not present

## 2019-10-13 DIAGNOSIS — E049 Nontoxic goiter, unspecified: Secondary | ICD-10-CM | POA: Diagnosis not present

## 2019-10-13 DIAGNOSIS — R05 Cough: Secondary | ICD-10-CM | POA: Diagnosis not present

## 2019-10-13 DIAGNOSIS — H539 Unspecified visual disturbance: Secondary | ICD-10-CM | POA: Diagnosis not present

## 2019-10-14 DIAGNOSIS — F419 Anxiety disorder, unspecified: Secondary | ICD-10-CM | POA: Diagnosis not present

## 2019-10-14 DIAGNOSIS — F22 Delusional disorders: Secondary | ICD-10-CM | POA: Diagnosis not present

## 2019-10-14 DIAGNOSIS — R05 Cough: Secondary | ICD-10-CM | POA: Diagnosis not present

## 2019-10-14 DIAGNOSIS — E049 Nontoxic goiter, unspecified: Secondary | ICD-10-CM | POA: Diagnosis not present

## 2019-10-21 ENCOUNTER — Other Ambulatory Visit: Payer: Self-pay

## 2019-10-21 ENCOUNTER — Encounter: Payer: Self-pay | Admitting: Psychiatry

## 2019-10-21 ENCOUNTER — Ambulatory Visit (INDEPENDENT_AMBULATORY_CARE_PROVIDER_SITE_OTHER): Payer: BC Managed Care – PPO | Admitting: Psychiatry

## 2019-10-21 VITALS — BP 135/77 | HR 86 | Ht 71.0 in | Wt 165.0 lb

## 2019-10-21 DIAGNOSIS — F40243 Fear of flying: Secondary | ICD-10-CM | POA: Diagnosis not present
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
## 2019-10-24 DIAGNOSIS — H539 Unspecified visual disturbance: Secondary | ICD-10-CM | POA: Diagnosis not present

## 2019-10-24 DIAGNOSIS — M542 Cervicalgia: Secondary | ICD-10-CM | POA: Diagnosis not present

## 2019-10-24 DIAGNOSIS — J320 Chronic maxillary sinusitis: Secondary | ICD-10-CM | POA: Diagnosis not present

## 2019-11-13 DIAGNOSIS — F22 Delusional disorders: Secondary | ICD-10-CM | POA: Diagnosis not present

## 2019-11-13 DIAGNOSIS — F419 Anxiety disorder, unspecified: Secondary | ICD-10-CM | POA: Diagnosis not present

## 2019-11-13 DIAGNOSIS — K219 Gastro-esophageal reflux disease without esophagitis: Secondary | ICD-10-CM | POA: Diagnosis not present

## 2019-11-13 DIAGNOSIS — R202 Paresthesia of skin: Secondary | ICD-10-CM | POA: Diagnosis not present

## 2019-11-15 DIAGNOSIS — F411 Generalized anxiety disorder: Secondary | ICD-10-CM | POA: Diagnosis not present

## 2019-11-16 ENCOUNTER — Encounter (HOSPITAL_COMMUNITY): Payer: Self-pay | Admitting: *Deleted

## 2019-11-16 ENCOUNTER — Emergency Department (HOSPITAL_COMMUNITY)
Admission: EM | Admit: 2019-11-16 | Discharge: 2019-11-16 | Disposition: A | Discharge status: Home / Self Care | Payer: BC Managed Care – PPO | Attending: Emergency Medicine | Admitting: Emergency Medicine

## 2019-11-16 ENCOUNTER — Other Ambulatory Visit: Payer: Self-pay

## 2019-11-16 ENCOUNTER — Emergency Department (HOSPITAL_COMMUNITY): Payer: BC Managed Care – PPO

## 2019-11-16 DIAGNOSIS — Z79899 Other long term (current) drug therapy: Secondary | ICD-10-CM | POA: Insufficient documentation

## 2019-11-16 DIAGNOSIS — M79642 Pain in left hand: Secondary | ICD-10-CM

## 2019-11-16 NOTE — ED Triage Notes (Signed)
The pt had bruises on the palm of his lt hand 2 days ago  So he took tumeric and now his hands look more normal  Except for some blue veins under neaththe skin surface  He reports tghat his feet have been hurting  He has a picture of his lt hand to show me and the doctor

## 2019-11-16 NOTE — ED Provider Notes (Signed)
[facility] EMERGENCY DEPARTMENT Provider Note   CSN: [id number] Arrival date & time: 11/16/19  0035     History Chief Complaint  Patient presents with  . unknown    Chase Mcneil is a 24 y.o. male.  HPI      Presents with concern for left hand pain, abnormal coloration Reports he was doing tricep dips a few days ago (later reports this was weeks ago) and accidentally pushed his hand against the pointy upright part of the bar, with immediate pain.  Reports soreness to the area of his hand towards the base of his thumb since his incident.  Yesterday morning, he woke up with discoloration of his left hand.  Reports it had appearance of bruising, blue coloration.  He had taken omeprazole and was worried about blood clots, reports he took some turmeric, and after that it seemed to get better.  Reports it lasted approximately 30 minutes.  Denies any fevers.  Reports that his body does run hot at times.  Reports on ROS some chronic dyspnea and chronic chest pain over several months which does not change.  Concerned that veins appear to be darker in the left hand.  Denies IVDU history.  Patient calls me back and now states that injury while doing tricep dip was several weeks ago and that hand color change was 2 days ago. He is showing me picture on his phone of his hand and video to prove it is him laying in his bed. Noted the timing of the video was 2 days ago. I did notice the timing of the photo of the hand color changes was 10PM yesterday.  He reports he did not come in right away as his parents encouraged him not to due to concern for cost.  Past Medical History:  Diagnosis Date  . GERD (gastroesophageal reflux disease)   . Rosacea keratitis     There are no problems to display for this patient.   Past Surgical History:  Procedure Laterality Date  . TONSILLECTOMY AND ADENOIDECTOMY         Family History  Problem Relation Age of Onset  . Liver cancer  Father   . Stomach cancer Neg Hx   . Colon cancer Neg Hx     Social History   Tobacco Use  . Smoking status: Never Smoker  . Smokeless tobacco: Never Used  Substance Use Topics  . Alcohol use: Yes    Comment: occ   . Drug use: Yes    Types: Marijuana    Home Medications Prior to Admission medications   Medication Sig Start Date End Date Taking? Authorizing Provider  COENZYME Q10 PO Take 30 mg by mouth 3 (three) times daily.    [provider]  OMEPRAZOLE PO Take by mouth.    [provider]    Allergies    Patient has no known allergies.  Review of Systems   Review of Systems  Constitutional: Negative for fever.  HENT: Negative for sore throat.   Respiratory: Negative for shortness of breath (chronic unchanged).   Cardiovascular: Negative for chest pain (chronic unchanged).  Gastrointestinal: Negative for nausea and vomiting.  Musculoskeletal: Positive for arthralgias. Negative for back pain and neck stiffness.  Skin: Positive for color change. Negative for rash.  Neurological: Negative for syncope.    Physical Exam Updated Vital Signs BP 117/90   Pulse 83   Temp 98.2 F (36.8 C) (Oral)   Resp 18  Ht 6' (1.829 m)   Wt 72.6 kg   SpO2 96%   BMI 21.71 kg/m   Physical Exam Vitals and nursing note reviewed.  Constitutional:      General: He is not in acute distress.    Appearance: Normal appearance. He is not ill-appearing, toxic-appearing or diaphoretic.  HENT:     Head: Normocephalic.  Eyes:     Conjunctiva/sclera: Conjunctivae normal.  Cardiovascular:     Rate and Rhythm: Normal rate and regular rhythm.     Pulses: Normal pulses.  Pulmonary:     Effort: Pulmonary effort is normal. No respiratory distress.  Musculoskeletal:        General: No deformity or signs of injury.     Cervical back: No rigidity.     Comments: Normal opponens, finger abduction, wrist extension Normal cap refill, normal pulses Mild tenderness to palm on  radial side, no snuff box tenderness  Skin:    General: Skin is warm and dry.     Coloration: Skin is not jaundiced or pale.     Comments: No sign of distal skin lesions on palms or soles of feet, normal pulses to bilateral feet  Neurological:     General: No focal deficit present.     Mental Status: He is alert and oriented to person, place, and time.          ED Results / Procedures / Treatments   Labs (all labs ordered are listed, but only abnormal results are displayed) Labs Reviewed - No data to display  EKG None  Radiology No results found.  Procedures Procedures (including critical care time)  Medications Ordered in ED Medications - No data to display  ED Course  I have reviewed the triage vital signs and the nursing notes.  Pertinent labs & imaging results that were available during my care of the patient were reviewed by me and considered in my medical decision making (see chart for details).    MDM Rules/Calculators/A&P                      24yo male with history above presents with concern for left hand pain and episode of discoloration of the hand yesterday morning.  No hand swelling, no edema, no sign of DVT. Normal pulses and cap refill, no sign of acute arterial thrombus. Normal coloration of the hand. NOrmal strenght.  Discussed possibility of performing XR of hand, but he calls me back in the room and clarifies that injury to the hand was not days ago but weeks ago and he reports the tenderness is not bad and would rather not have XR. Doubt scaphoid fracture/no snuff box tenderness.  Possible carpal tunnel, or other symptoms from peripheral nerve compression.  He has been reading on the internet and is very anxious about blood clots but I discussed he has normal exam at this time and I do not see signs of acute arterial or venous thrombosis and discussed he may follow up with his physician for further evaluation or return if the symptoms  recur.  Recommend close PCP follow up, discussed reasons to return.  Final Clinical Impression(s) / ED Diagnoses Final diagnoses:  Left hand pain    Rx / DC Orders ED Discharge Orders    None       Gareth Morgan, MD 11/16/19 1106

## 2019-12-18 DIAGNOSIS — F419 Anxiety disorder, unspecified: Secondary | ICD-10-CM | POA: Diagnosis not present

## 2019-12-18 DIAGNOSIS — R58 Hemorrhage, not elsewhere classified: Secondary | ICD-10-CM | POA: Diagnosis not present

## 2019-12-28 ENCOUNTER — Emergency Department (HOSPITAL_COMMUNITY): Payer: BC Managed Care – PPO

## 2019-12-28 ENCOUNTER — Encounter (HOSPITAL_COMMUNITY): Payer: Self-pay

## 2019-12-28 ENCOUNTER — Emergency Department (HOSPITAL_COMMUNITY)
Admission: EM | Admit: 2019-12-28 | Discharge: 2019-12-29 | Disposition: A | Discharge status: Home / Self Care | Payer: BC Managed Care – PPO | Attending: Emergency Medicine | Admitting: Emergency Medicine

## 2019-12-28 DIAGNOSIS — J439 Emphysema, unspecified: Secondary | ICD-10-CM | POA: Diagnosis not present

## 2019-12-28 DIAGNOSIS — F22 Delusional disorders: Secondary | ICD-10-CM | POA: Diagnosis not present

## 2019-12-28 DIAGNOSIS — Z046 Encounter for general psychiatric examination, requested by authority: Secondary | ICD-10-CM | POA: Insufficient documentation

## 2019-12-28 DIAGNOSIS — Z79899 Other long term (current) drug therapy: Secondary | ICD-10-CM | POA: Diagnosis not present

## 2019-12-28 DIAGNOSIS — R259 Unspecified abnormal involuntary movements: Secondary | ICD-10-CM | POA: Diagnosis not present

## 2019-12-28 DIAGNOSIS — Z03818 Encounter for observation for suspected exposure to other biological agents ruled out: Secondary | ICD-10-CM | POA: Diagnosis not present

## 2019-12-28 DIAGNOSIS — Z7982 Long term (current) use of aspirin: Secondary | ICD-10-CM | POA: Insufficient documentation

## 2019-12-28 DIAGNOSIS — S199XXA Unspecified injury of neck, initial encounter: Secondary | ICD-10-CM | POA: Diagnosis not present

## 2019-12-28 DIAGNOSIS — Z20822 Contact with and (suspected) exposure to covid-19: Secondary | ICD-10-CM | POA: Insufficient documentation

## 2019-12-28 DIAGNOSIS — S59911A Unspecified injury of right forearm, initial encounter: Secondary | ICD-10-CM | POA: Diagnosis not present

## 2019-12-28 DIAGNOSIS — R4182 Altered mental status, unspecified: Secondary | ICD-10-CM | POA: Diagnosis not present

## 2019-12-28 DIAGNOSIS — E079 Disorder of thyroid, unspecified: Secondary | ICD-10-CM | POA: Diagnosis not present

## 2019-12-28 LAB — COMPREHENSIVE METABOLIC PANEL
ALT: 17 U/L (ref 0–44)
AST: 20 U/L (ref 15–41)
Albumin: 4.9 g/dL (ref 3.5–5.0)
Alkaline Phosphatase: 58 U/L (ref 38–126)
Anion gap: 13 (ref 5–15)
BUN: 10 mg/dL (ref 6–20)
CO2: 24 mmol/L (ref 22–32)
Calcium: 9.7 mg/dL (ref 8.9–10.3)
Chloride: 103 mmol/L (ref 98–111)
Creatinine, Ser: 0.96 mg/dL (ref 0.61–1.24)
GFR calc Af Amer: >60 mL/min (ref >60)
GFR calc non Af Amer: >60 mL/min (ref >60)
Glucose, Bld: 104 mg/dL — ABNORMAL HIGH (ref 70–99)
Potassium: 3.4 mmol/L — ABNORMAL LOW (ref 3.5–5.1)
Sodium: 140 mmol/L (ref 135–145)
Total Bilirubin: 1.2 mg/dL (ref 0.3–1.2)
Total Protein: 7.5 g/dL (ref 6.5–8.1)

## 2019-12-28 LAB — CBC
HCT: 48.9 % (ref 39.0–52.0)
Hemoglobin: 16.9 g/dL (ref 13.0–17.0)
MCH: 32.9 pg (ref 26.0–34.0)
MCHC: 34.6 g/dL (ref 30.0–36.0)
MCV: 95.1 fL (ref 80.0–100.0)
Platelets: 267 10*3/uL (ref 150–400)
RBC: 5.14 MIL/uL (ref 4.22–5.81)
RDW: 12.5 % (ref 11.5–15.5)
WBC: 6.6 10*3/uL (ref 4.0–10.5)
nRBC: 0 % (ref 0.0–0.2)

## 2019-12-28 LAB — RAPID URINE DRUG SCREEN, HOSP PERFORMED
Amphetamines: NOT DETECTED
Barbiturates: NOT DETECTED
Benzodiazepines: POSITIVE — AB
Cocaine: NOT DETECTED
Opiates: NOT DETECTED
Tetrahydrocannabinol: POSITIVE — AB

## 2019-12-28 LAB — ETHANOL: Alcohol, Ethyl (B): <10 mg/dL (ref <10)

## 2019-12-28 LAB — SALICYLATE LEVEL: Salicylate Lvl: <7 mg/dL — ABNORMAL LOW (ref 7.0–30.0)

## 2019-12-28 LAB — ACETAMINOPHEN LEVEL: Acetaminophen (Tylenol), Serum: <10 ug/mL — ABNORMAL LOW (ref 10–30)

## 2019-12-28 MED ORDER — NICOTINE 21 MG/24HR TD PT24
21.0000 mg | MEDICATED_PATCH | Freq: Every day | TRANSDERMAL | Status: DC
  Administered 2019-12-28: 21 mg via TRANSDERMAL
  Filled 2019-12-28 (×2): qty 1
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
## 2019-12-28 NOTE — ED Notes (Signed)
Pt transported to MRI 

## 2019-12-29 ENCOUNTER — Encounter (HOSPITAL_COMMUNITY): Payer: Self-pay

## 2019-12-29 ENCOUNTER — Emergency Department (HOSPITAL_COMMUNITY)
Admission: EM | Admit: 2019-12-29 | Discharge: 2019-12-31 | Disposition: A | Discharge status: Other Acute Inpatient Hospital | Payer: BC Managed Care – PPO | Source: Home / Self Care | Attending: Emergency Medicine | Admitting: Emergency Medicine

## 2019-12-29 ENCOUNTER — Emergency Department (HOSPITAL_COMMUNITY): Payer: BC Managed Care – PPO

## 2019-12-29 ENCOUNTER — Encounter (HOSPITAL_COMMUNITY)
Admission: EM | Disposition: A | Discharge status: Other Acute Inpatient Hospital | Payer: Self-pay | Source: Home / Self Care | Attending: Emergency Medicine

## 2019-12-29 DIAGNOSIS — M795 Residual foreign body in soft tissue: Secondary | ICD-10-CM

## 2019-12-29 DIAGNOSIS — E079 Disorder of thyroid, unspecified: Secondary | ICD-10-CM | POA: Diagnosis not present

## 2019-12-29 DIAGNOSIS — S1191XA Laceration without foreign body of unspecified part of neck, initial encounter: Secondary | ICD-10-CM

## 2019-12-29 DIAGNOSIS — Z23 Encounter for immunization: Secondary | ICD-10-CM | POA: Insufficient documentation

## 2019-12-29 DIAGNOSIS — R58 Hemorrhage, not elsewhere classified: Secondary | ICD-10-CM | POA: Diagnosis not present

## 2019-12-29 DIAGNOSIS — Z79899 Other long term (current) drug therapy: Secondary | ICD-10-CM | POA: Insufficient documentation

## 2019-12-29 DIAGNOSIS — F29 Unspecified psychosis not due to a substance or known physiological condition: Secondary | ICD-10-CM | POA: Insufficient documentation

## 2019-12-29 DIAGNOSIS — S1181XA Laceration without foreign body of other specified part of neck, initial encounter: Secondary | ICD-10-CM | POA: Diagnosis not present

## 2019-12-29 DIAGNOSIS — M79631 Pain in right forearm: Secondary | ICD-10-CM | POA: Insufficient documentation

## 2019-12-29 DIAGNOSIS — S59911A Unspecified injury of right forearm, initial encounter: Secondary | ICD-10-CM | POA: Diagnosis not present

## 2019-12-29 DIAGNOSIS — X58XXXA Exposure to other specified factors, initial encounter: Secondary | ICD-10-CM | POA: Insufficient documentation

## 2019-12-29 DIAGNOSIS — Y939 Activity, unspecified: Secondary | ICD-10-CM | POA: Insufficient documentation

## 2019-12-29 DIAGNOSIS — Y929 Unspecified place or not applicable: Secondary | ICD-10-CM | POA: Insufficient documentation

## 2019-12-29 DIAGNOSIS — R000 Tachycardia, unspecified: Secondary | ICD-10-CM | POA: Diagnosis not present

## 2019-12-29 DIAGNOSIS — J439 Emphysema, unspecified: Secondary | ICD-10-CM | POA: Diagnosis not present

## 2019-12-29 DIAGNOSIS — S199XXA Unspecified injury of neck, initial encounter: Secondary | ICD-10-CM | POA: Diagnosis not present

## 2019-12-29 DIAGNOSIS — Y999 Unspecified external cause status: Secondary | ICD-10-CM | POA: Insufficient documentation

## 2019-12-29 HISTORY — PX: ESOPHAGOGASTRODUODENOSCOPY: SHX5428

## 2019-12-29 LAB — CBC
HCT: 45.6 % (ref 39.0–52.0)
Hemoglobin: 16 g/dL (ref 13.0–17.0)
MCH: 33.1 pg (ref 26.0–34.0)
MCHC: 35.1 g/dL (ref 30.0–36.0)
MCV: 94.4 fL (ref 80.0–100.0)
Platelets: 247 10*3/uL (ref 150–400)
RBC: 4.83 MIL/uL (ref 4.22–5.81)
RDW: 12.2 % (ref 11.5–15.5)
WBC: 10.5 10*3/uL (ref 4.0–10.5)
nRBC: 0 % (ref 0.0–0.2)

## 2019-12-29 LAB — I-STAT CHEM 8, ED
BUN: 13 mg/dL (ref 6–20)
Calcium, Ion: 1.08 mmol/L — ABNORMAL LOW (ref 1.15–1.40)
Chloride: 102 mmol/L (ref 98–111)
Creatinine, Ser: 0.9 mg/dL (ref 0.61–1.24)
Glucose, Bld: 139 mg/dL — ABNORMAL HIGH (ref 70–99)
HCT: 45 % (ref 39.0–52.0)
Hemoglobin: 15.3 g/dL (ref 13.0–17.0)
Potassium: 2.9 mmol/L — ABNORMAL LOW (ref 3.5–5.1)
Sodium: 142 mmol/L (ref 135–145)
TCO2: 22 mmol/L (ref 22–32)

## 2019-12-29 LAB — ETHANOL: Alcohol, Ethyl (B): <10 mg/dL (ref <10)

## 2019-12-29 LAB — PROTIME-INR
INR: 1.1 (ref 0.8–1.2)
Prothrombin Time: 13.7 seconds (ref 11.4–15.2)

## 2019-12-29 LAB — COMPREHENSIVE METABOLIC PANEL
ALT: 14 U/L (ref 0–44)
AST: 21 U/L (ref 15–41)
Albumin: 4.4 g/dL (ref 3.5–5.0)
Alkaline Phosphatase: 49 U/L (ref 38–126)
Anion gap: 15 (ref 5–15)
BUN: 10 mg/dL (ref 6–20)
CO2: 21 mmol/L — ABNORMAL LOW (ref 22–32)
Calcium: 9 mg/dL (ref 8.9–10.3)
Chloride: 104 mmol/L (ref 98–111)
Creatinine, Ser: 1.09 mg/dL (ref 0.61–1.24)
GFR calc Af Amer: >60 mL/min (ref >60)
GFR calc non Af Amer: >60 mL/min (ref >60)
Glucose, Bld: 143 mg/dL — ABNORMAL HIGH (ref 70–99)
Potassium: 3 mmol/L — ABNORMAL LOW (ref 3.5–5.1)
Sodium: 140 mmol/L (ref 135–145)
Total Bilirubin: 0.9 mg/dL (ref 0.3–1.2)
Total Protein: 6.6 g/dL (ref 6.5–8.1)

## 2019-12-29 LAB — LACTIC ACID, PLASMA: Lactic Acid, Venous: 2.7 mmol/L (ref 0.5–1.9)

## 2019-12-29 LAB — SAMPLE TO BLOOD BANK

## 2019-12-29 LAB — SARS CORONAVIRUS 2 BY RT PCR (HOSPITAL ORDER, PERFORMED IN ~~LOC~~ HOSPITAL LAB): SARS Coronavirus 2: NEGATIVE

## 2019-12-29 SURGERY — EGD (ESOPHAGOGASTRODUODENOSCOPY)
Anesthesia: Moderate Sedation

## 2019-12-29 MED ORDER — STERILE WATER FOR INJECTION IJ SOLN
INTRAMUSCULAR | Status: AC
  Filled 2019-12-29: qty 10

## 2019-12-29 MED ORDER — CEFAZOLIN SODIUM-DEXTROSE 2-4 GM/100ML-% IV SOLN
2.0000 g | Freq: Once | INTRAVENOUS | Status: AC
  Administered 2019-12-29: 2 g via INTRAVENOUS

## 2019-12-29 MED ORDER — OLANZAPINE 5 MG PO TBDP
5.0000 mg | ORAL_TABLET | Freq: Two times a day (BID) | ORAL | Status: DC
  Administered 2019-12-30 – 2019-12-31 (×3): 5 mg via ORAL
  Filled 2019-12-29 (×4): qty 1

## 2019-12-29 MED ORDER — MIDAZOLAM HCL (PF) 5 MG/ML IJ SOLN
INTRAMUSCULAR | Status: AC
  Filled 2019-12-29: qty 2

## 2019-12-29 MED ORDER — ZIPRASIDONE MESYLATE 20 MG IM SOLR
20.0000 mg | Freq: Once | INTRAMUSCULAR | Status: AC
  Administered 2019-12-29: 20 mg via INTRAMUSCULAR
  Filled 2019-12-29: qty 20

## 2019-12-29 MED ORDER — TETANUS-DIPHTH-ACELL PERTUSSIS 5-2.5-18.5 LF-MCG/0.5 IM SUSP
0.5000 mL | Freq: Once | INTRAMUSCULAR | Status: AC
  Administered 2019-12-29: 0.5 mL via INTRAMUSCULAR

## 2019-12-29 MED ORDER — MIDAZOLAM HCL (PF) 10 MG/2ML IJ SOLN
INTRAMUSCULAR | Status: DC | PRN
  Administered 2019-12-29: 1 mg via INTRAVENOUS
  Administered 2019-12-29: 2 mg via INTRAVENOUS

## 2019-12-29 MED ORDER — SODIUM CHLORIDE 0.9 % IV BOLUS
500.0000 mL | Freq: Once | INTRAVENOUS | Status: AC
  Administered 2019-12-29: 500 mL via INTRAVENOUS

## 2019-12-29 MED ORDER — ZIPRASIDONE MESYLATE 20 MG IM SOLR: 10.0000 mg | Freq: Once | INTRAMUSCULAR | Status: DC

## 2019-12-29 MED ORDER — FENTANYL CITRATE (PF) 100 MCG/2ML IJ SOLN
INTRAMUSCULAR | Status: DC | PRN
  Administered 2019-12-29: 50 ug via INTRAVENOUS
  Administered 2019-12-29: 25 ug via INTRAVENOUS

## 2019-12-29 MED ORDER — BENZTROPINE MESYLATE 1 MG PO TABS: 1.0000 mg | ORAL_TABLET | Freq: Four times a day (QID) | ORAL | Status: DC | PRN

## 2019-12-29 MED ORDER — FENTANYL CITRATE (PF) 100 MCG/2ML IJ SOLN
INTRAMUSCULAR | Status: AC
  Filled 2019-12-29: qty 2

## 2019-12-29 MED ORDER — ALPRAZOLAM 0.25 MG PO TABS: 0.5000 mg | ORAL_TABLET | Freq: Two times a day (BID) | ORAL | Status: DC | PRN

## 2019-12-29 MED ORDER — ZIPRASIDONE MESYLATE 20 MG IM SOLR
10.0000 mg | Freq: Four times a day (QID) | INTRAMUSCULAR | Status: DC | PRN
  Filled 2019-12-29: qty 20

## 2019-12-29 MED ORDER — HALOPERIDOL 5 MG PO TABS: 5.0000 mg | ORAL_TABLET | Freq: Once | ORAL | Status: DC

## 2019-12-29 MED ORDER — ZIPRASIDONE MESYLATE 20 MG IM SOLR
INTRAMUSCULAR | Status: AC
  Filled 2019-12-29: qty 20

## 2019-12-29 MED ORDER — HALOPERIDOL 5 MG PO TABS: 5.0000 mg | ORAL_TABLET | Freq: Four times a day (QID) | ORAL | Status: DC | PRN

## 2019-12-29 MED ORDER — IOHEXOL 350 MG/ML SOLN
50.0000 mL | Freq: Once | INTRAVENOUS | Status: AC | PRN
  Administered 2019-12-29: 50 mL via INTRAVENOUS

## 2019-12-29 MED ORDER — ZIPRASIDONE MESYLATE 20 MG IM SOLR: 20.0000 mg | INTRAMUSCULAR | Status: DC | PRN
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
## 2019-12-29 NOTE — ED Notes (Signed)
Patient has asked several weird questions to staff; Pt requested to be able to shave and was advised he was not able shave while here; pt also wanted to change out of gown into scrubs; panels was placed at every entrance for added security-Monique,RN

## 2019-12-29 NOTE — ED Notes (Signed)
Patient arrives to purple unit with parents; Parents wanted to be sure staff kept and eye on patient due to flight risk and to see where patient will be staying; pt has clean dressing covering wound and was transferred to hospital bed by NT; Family was asked about visitation and was given rules due to Covid and area patient is transferred to; Family encouraged to call day shift RN for concerns; No obvious distress noted from patient at time of transfer and parents given time to speak with patient before [facility]
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
## 2019-12-29 NOTE — ED Notes (Signed)
Pt book bag with cell phone and all other belongings given to Mother and Father.

## 2019-12-29 NOTE — Progress Notes (Signed)
Chaplain responded to Trauma page and was present to support the family. The chaplain is available if further support is needed.  Lavone Neri Chaplain Resident For questions concerning this note please contact me by pager [phone]

## 2019-12-29 NOTE — ED Notes (Signed)
Patient was seen by staff feeling for wire in mask once mask was given to him; Staff will continue to monitor patient for SI behaviors-Monique,RN
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
## 2019-12-29 NOTE — ED Notes (Signed)
Spoke with MD regarding pt becoming increasingly agitated and pacing. MD notified RN concern that patient may try to run. New orders to be placed.
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
## 2019-12-29 NOTE — ED Notes (Addendum)
Pt remains at large; RN called mother to see if pt has returned home. He went to [facility] and finished. 2019 he moved to [place] and began to study CBD and how to grow cannabis. He started to think grower was FBI. And he began to get depressed. He has refused to take meds for this. He has seen several psychiatrist who have recommended meds. He has been diagnosed with somatic delusional disorder.
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
## 2019-12-29 NOTE — ED Notes (Signed)
Family at the bedside.

## 2019-12-29 NOTE — Progress Notes (Signed)
Writer received orders from Dr. Lucianne Muss to initiate agitation protocol, and start patient on Zyprexa zydis 5mg  po BID for psychosis. Patient to be assessed by TTS once medically cleared, and will need inpatient admission.

## 2019-12-29 NOTE — ED Notes (Addendum)
RN attempted to give PO med but patient states he does not take anything he does not know what it is; RN advised of medication patient states he has taken before and it make his anxiety worse; Patient has weird behaviors to RN and EDP notified to place IM PRN orders if needed throughout the night; pt was given mask after asking several times; wire was cut out of mask for safety purposes-Monique,RN
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
## 2019-12-30 ENCOUNTER — Other Ambulatory Visit: Payer: Self-pay

## 2019-12-30 LAB — LACTIC ACID, PLASMA: Lactic Acid, Venous: 0.6 mmol/L (ref 0.5–1.9)

## 2019-12-30 LAB — MAGNESIUM: Magnesium: 2 mg/dL (ref 1.7–2.4)

## 2019-12-30 MED ORDER — POTASSIUM CHLORIDE CRYS ER 20 MEQ PO TBCR
40.0000 meq | EXTENDED_RELEASE_TABLET | Freq: Two times a day (BID) | ORAL | Status: DC
  Administered 2019-12-30 – 2019-12-31 (×3): 40 meq via ORAL
  Filled 2019-12-30 (×3): qty 2

## 2019-12-30 NOTE — ED Notes (Addendum)
Pt's father called and asked if may visit w/pt. Advised yes, per Retta Diones Director. Advised may visit at 1100. Father asked if may bring pt clothing - advised none needed in ED and he may bring permitted clothing items when he arrives to mental health facility - [facility]. Voiced understanding. Pt showered.

## 2019-12-30 NOTE — ED Notes (Signed)
Pt called parents from phone at nurses' desk - Have now arrived visiting w/pt. Dinner tray delivered - Encouraging pt to eat.
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
## 2019-12-30 NOTE — ED Notes (Signed)
Pt initially attempted to refuse po meds - then agreed to take them after much encouragement. Pt refusing for RN to remove "Jonny Ruiz Doe" ID bracelet.

## 2019-12-30 NOTE — BH Assessment (Signed)
Called to reassess pt.  RN, Kriste Basque states patient just fell asleep.  TTS will attempt to reassess at a later time.  Patient was referred to [facility] today and is under review.

## 2019-12-30 NOTE — ED Notes (Signed)
Mom and dad in Room with Pt. Mom is currently feeding Pt

## 2019-12-30 NOTE — ED Notes (Signed)
Dr. Wyatt at  Bedside  

## 2019-12-30 NOTE — ED Notes (Signed)
Parents are leaving at this time and will return around 1700.

## 2019-12-30 NOTE — ED Notes (Signed)
Pt allowed me to do an EKG on him. He was corruptive and pleasant. He asked many times "Is Maralyn Sago the nurse working? I really want to talk to her about being a ghost."

## 2019-12-30 NOTE — ED Notes (Signed)
Pt allowed blood work to be drawn w/o difficulty. Encouraged pt to eat lunch.
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
## 2019-12-30 NOTE — ED Notes (Signed)
Pt now awake and alert and again asking for RN Maralyn Sago. Maralyn Sago is aware but is with a patient and is not able to come visit with patient tonight.

## 2019-12-30 NOTE — ED Notes (Signed)
Parents have arrived and are visiting w/pt.

## 2019-12-31 ENCOUNTER — Other Ambulatory Visit: Payer: Self-pay

## 2019-12-31 ENCOUNTER — Encounter (HOSPITAL_COMMUNITY): Payer: Self-pay | Admitting: Surgery

## 2019-12-31 ENCOUNTER — Inpatient Hospital Stay (HOSPITAL_COMMUNITY)
Admission: EM | Admit: 2019-12-31 | Discharge: 2020-01-05 | DRG: 876 | Disposition: A | Discharge status: Home / Self Care | Payer: BC Managed Care – PPO | Source: Intra-hospital | Attending: Psychiatry | Admitting: Psychiatry

## 2019-12-31 ENCOUNTER — Emergency Department (HOSPITAL_COMMUNITY)
Admission: EM | Admit: 2019-12-31 | Discharge: 2019-12-31 | Disposition: A | Discharge status: Home / Self Care | Payer: BC Managed Care – PPO | Attending: Emergency Medicine | Admitting: Emergency Medicine

## 2019-12-31 DIAGNOSIS — F4310 Post-traumatic stress disorder, unspecified: Secondary | ICD-10-CM | POA: Diagnosis present

## 2019-12-31 DIAGNOSIS — G4700 Insomnia, unspecified: Secondary | ICD-10-CM | POA: Diagnosis present

## 2019-12-31 DIAGNOSIS — Z62810 Personal history of physical and sexual abuse in childhood: Secondary | ICD-10-CM | POA: Diagnosis present

## 2019-12-31 DIAGNOSIS — W269XXA Contact with unspecified sharp object(s), initial encounter: Secondary | ICD-10-CM | POA: Diagnosis not present

## 2019-12-31 DIAGNOSIS — E876 Hypokalemia: Secondary | ICD-10-CM | POA: Diagnosis present

## 2019-12-31 DIAGNOSIS — K625 Hemorrhage of anus and rectum: Secondary | ICD-10-CM | POA: Diagnosis not present

## 2019-12-31 DIAGNOSIS — Z7982 Long term (current) use of aspirin: Secondary | ICD-10-CM

## 2019-12-31 DIAGNOSIS — F1220 Cannabis dependence, uncomplicated: Secondary | ICD-10-CM | POA: Diagnosis present

## 2019-12-31 DIAGNOSIS — Z79899 Other long term (current) drug therapy: Secondary | ICD-10-CM | POA: Diagnosis not present

## 2019-12-31 DIAGNOSIS — R45851 Suicidal ideations: Secondary | ICD-10-CM | POA: Diagnosis not present

## 2019-12-31 DIAGNOSIS — F17210 Nicotine dependence, cigarettes, uncomplicated: Secondary | ICD-10-CM | POA: Diagnosis present

## 2019-12-31 DIAGNOSIS — F23 Brief psychotic disorder: Principal | ICD-10-CM | POA: Diagnosis present

## 2019-12-31 DIAGNOSIS — S1191XA Laceration without foreign body of unspecified part of neck, initial encounter: Secondary | ICD-10-CM | POA: Diagnosis not present

## 2019-12-31 DIAGNOSIS — F411 Generalized anxiety disorder: Secondary | ICD-10-CM | POA: Diagnosis present

## 2019-12-31 DIAGNOSIS — Z743 Need for continuous supervision: Secondary | ICD-10-CM | POA: Diagnosis not present

## 2019-12-31 DIAGNOSIS — F4010 Social phobia, unspecified: Secondary | ICD-10-CM | POA: Diagnosis present

## 2019-12-31 DIAGNOSIS — F329 Major depressive disorder, single episode, unspecified: Secondary | ICD-10-CM | POA: Diagnosis present

## 2019-12-31 DIAGNOSIS — R279 Unspecified lack of coordination: Secondary | ICD-10-CM | POA: Diagnosis not present

## 2019-12-31 DIAGNOSIS — K921 Melena: Secondary | ICD-10-CM | POA: Diagnosis not present

## 2019-12-31 MED ORDER — OLANZAPINE 5 MG PO TBDP
5.0000 mg | ORAL_TABLET | Freq: Two times a day (BID) | ORAL | Status: DC
  Filled 2019-12-31 (×2): qty 1

## 2019-12-31 MED ORDER — MAGNESIUM HYDROXIDE 400 MG/5ML PO SUSP: 30.0000 mL | Freq: Every day | ORAL | Status: DC | PRN

## 2019-12-31 MED ORDER — LORAZEPAM 1 MG PO TABS: 1.0000 mg | ORAL_TABLET | ORAL | Status: DC | PRN

## 2019-12-31 MED ORDER — NEOMYCIN-POLYMYXIN-PRAMOXINE 1 % EX CREA
TOPICAL_CREAM | Freq: Two times a day (BID) | CUTANEOUS | Status: DC
  Filled 2019-12-31: qty 28

## 2019-12-31 MED ORDER — ZIPRASIDONE MESYLATE 20 MG IM SOLR: 20.0000 mg | INTRAMUSCULAR | Status: DC | PRN

## 2019-12-31 MED ORDER — OLANZAPINE 10 MG PO TBDP
10.0000 mg | ORAL_TABLET | Freq: Every day | ORAL | Status: DC
  Administered 2019-12-31: 10 mg via ORAL
  Filled 2019-12-31 (×4): qty 1

## 2019-12-31 MED ORDER — OLANZAPINE 5 MG PO TBDP: 5.0000 mg | ORAL_TABLET | Freq: Three times a day (TID) | ORAL | Status: DC | PRN

## 2019-12-31 MED ORDER — ACETAMINOPHEN 325 MG PO TABS
650.0000 mg | ORAL_TABLET | Freq: Four times a day (QID) | ORAL | Status: DC | PRN
  Administered 2020-01-01: 650 mg via ORAL
  Filled 2019-12-31: qty 2

## 2019-12-31 MED ORDER — POTASSIUM CHLORIDE CRYS ER 20 MEQ PO TBCR
40.0000 meq | EXTENDED_RELEASE_TABLET | Freq: Two times a day (BID) | ORAL | Status: DC
  Filled 2019-12-31 (×2): qty 2

## 2019-12-31 MED ORDER — ALUM & MAG HYDROXIDE-SIMETH 200-200-20 MG/5ML PO SUSP: 30.0000 mL | ORAL | Status: DC | PRN

## 2019-12-31 MED ORDER — LORAZEPAM 1 MG PO TABS
1.0000 mg | ORAL_TABLET | Freq: Three times a day (TID) | ORAL | Status: DC
  Administered 2019-12-31 – 2020-01-02 (×4): 1 mg via ORAL
  Filled 2019-12-31 (×4): qty 1

## 2019-12-31 MED ORDER — HYDROXYZINE HCL 25 MG PO TABS: 25.0000 mg | ORAL_TABLET | Freq: Three times a day (TID) | ORAL | Status: DC | PRN

## 2019-12-31 MED ORDER — ZIPRASIDONE MESYLATE 20 MG IM SOLR: 20.0000 mg | Freq: Two times a day (BID) | INTRAMUSCULAR | Status: DC | PRN

## 2019-12-31 MED ORDER — TRAZODONE HCL 50 MG PO TABS: 50.0000 mg | ORAL_TABLET | Freq: Every evening | ORAL | Status: DC | PRN

## 2019-12-31 MED ORDER — OLANZAPINE 5 MG PO TBDP
5.0000 mg | ORAL_TABLET | Freq: Every day | ORAL | Status: DC
  Administered 2019-12-31 – 2020-01-04 (×5): 5 mg via ORAL
  Filled 2019-12-31 (×9): qty 1

## 2019-12-31 MED ORDER — POTASSIUM CHLORIDE CRYS ER 20 MEQ PO TBCR
20.0000 meq | EXTENDED_RELEASE_TABLET | Freq: Once | ORAL | Status: DC
  Filled 2019-12-31: qty 1

## 2019-12-31 NOTE — [facility] Group Notes (Signed)
Adult Psychoeducational Group Note  Date:  12/31/2019 Time:  9:59 PM  Group Topic/Focus:  Wrap-Up Group:   The focus of this group is to help patients review their daily goal of treatment and discuss progress on daily workbooks.  Participation Level:  Active  Participation Quality:  Appropriate  Affect:  Appropriate  Cognitive:  Alert and Appropriate  Insight: Good  Engagement in Group:  Engaged  Modes of Intervention:  Discussion  Additional Comments:   Jacalyn Lefevre 12/31/2019, 9:59 PM

## 2019-12-31 NOTE — ED Notes (Signed)
Pt's lunch arrived 
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
## 2019-12-31 NOTE — ED Notes (Addendum)
Pt took meds w/o difficulty then began to c/o throat pain after swallowing Potassium. Pt drank extra water as encouraged. Pt now denies any difficulty swallowing/speaking/breathing at this time. Pt ate breakfast this am w/o difficulty as well.

## 2019-12-31 NOTE — BH Specialist Note (Addendum)
This SRA was inadvertently dictated into the chart with the date of 11/16/2019.  This is the SRA from the admission on 12/31/2019.  I have asked the Epic help desk to attempt to move that note to the chart which is accurate. Antonieta Pert, MD  Psychiatrist  Psychiatry  [facility] Suicide Risk Assessment     Signed  Date of Service:  11/16/2019 8:50 AM          Signed       Show:Clear all [x] Manual[x] Template[] Copied  Added by: [x] Ravleen Ries, , MD  [] Hover for details [facility] Admission Suicide Risk Assessment   Nursing information obtained from:    Demographic factors:    Current Mental Status:    Loss Factors:    Historical Factors:    Risk Reduction Factors:     Total Time spent with patient: 45 minutes Principal Problem: <principal problem not specified> Diagnosis:  Active Problems:   * No active hospital problems. *  Subjective Data: Patient is seen and examined.  Patient is a 24 year old male who originally was placed under involuntary commitment and sent to the [facility] emergency department on 12/28/2019.  The involuntary commitment paperwork stated that the patient had recently been diagnosed with "delusional disorder".  The patient is very vague in his description of anything going on.  He apparently had been seen by Dr. 22 recently and received Xanax for flying.  He denied any auditory or visual hallucinations.  He denied any suicidal or homicidal ideation.  He denied any special powers or special abilities.  He stated to me that they had told him that he would be able to leave after a day or so.  I told him I would be unable to decide that until had a better handle of what was going on.  He asked me if his mother could pick him up when taking home.  I told him again I would have to wait and assess what was going on.  Review of the electronic medical record revealed that he had been recently prescribed Xanax and self medicates with marijuana.   He also eats edibles that have THC as a primary component.  He stated that they were only "CBD".  He did report eating edibles at least once to twice a day and also smokes marijuana, but minimized the marijuana use.  In the electronic medical record he reported that he was only getting 2 to 3 hours of sleep a night, but in my interview with him today he stated that it only happen occasionally and "everybody has trouble sleeping sometimes".  He did state that he had trouble with anxiety in the past and "shoulder pain".  He stated that is why he started using marijuana CBD oil.  He stated that he usually lives in [place] [facility] with his father.  He stated that his father had a CBD oil business.  Other symptoms reported by collateral information from his father said that he had had a psychiatric history of the last 10 years.  He had apparently a very traumatic experience at age 36, but there were not a great deal of details of that.  His mother reported that the patient had not slept for the last 3 days, and had been delusional about his mother not being his real mother, and that she was controlled by the [employer].  There was also some situation at an airport earlier where he was attempting to leave [place] to me "[place]".  He apparently  has been seeing psychiatrist over the last 10 years and had been diagnosed with a delusional disorder, but I not have any information on any of that.  He is a poor historian, and difficult to obtain history from.  He was admitted to the hospital for evaluation and stabilization.  There are notes from the emergency room visit [facility] emergency department that revealed that the patient had become increasingly agitated and pacing.  The patient requested for the nurses to call the FBI for him.  He left the emergency department as they were attempting to medicate him with Geodon.  He avoided the police and got out of the hospital.  Police were made aware of this, and  apparently picked him up and brought him to our facility.  Continued Clinical Symptoms:  The "Alcohol Use Disorders Identification Test", Guidelines for Use in Primary Care, Second Edition.  World Science writer [facility]). Score between 0-7:  no or low risk or alcohol related problems. Score between 8-15:  moderate risk of alcohol related problems. Score between 16-19:  high risk of alcohol related problems. Score 20 or above:  warrants further diagnostic evaluation for alcohol dependence and treatment.   CLINICAL FACTORS:   Alcohol/Substance Abuse/Dependencies Currently Psychotic   Musculoskeletal: Strength & Muscle Tone: within normal limits Gait & Station: normal Patient leans: N/A  Psychiatric Specialty Exam: Physical Exam Vitals and nursing note reviewed.  Constitutional:      Appearance: Normal appearance.  HENT:     Head: Normocephalic and atraumatic.  Pulmonary:     Effort: Pulmonary effort is normal.  Neurological:     General: No focal deficit present.     Mental Status: He is alert and oriented to person, place, and time.     Review of Systems  Blood pressure 117/90, pulse 83, temperature 98.2 F (36.8 C), temperature source Oral, resp. rate 18, height 6' (1.829 m), weight 72.6 kg, SpO2 96 %.Body mass index is 21.71 kg/m.  General Appearance: Casual  Eye Contact:  Fair  Speech:  Normal Rate  Volume:  Decreased  Mood:  Anxious and Dysphoric  Affect:  Congruent  Thought Process:  Goal Directed and Descriptions of Associations: Circumstantial  Orientation:  Full (Time, Place, and Person)  Thought Content:  Delusions and Rumination  Suicidal Thoughts:  No  Homicidal Thoughts:  No  Memory:  Immediate;   Poor Recent;   Poor Remote;   Poor  Judgement:  Impaired  Insight:  Lacking  Psychomotor Activity:  Normal  Concentration:  Concentration: Fair and Attention Span: Fair  Recall:  [employer] of Knowledge:  Fair  Language:  Fair  Akathisia:   Negative  Handed:  Right  AIMS (if indicated):     Assets:  Desire for Improvement Resilience  ADL's:  Intact  Cognition:  WNL  Sleep:         COGNITIVE FEATURES THAT CONTRIBUTE TO RISK:  Closed-mindedness    SUICIDE RISK:   Mild:  Suicidal ideation of limited frequency, intensity, duration, and specificity.  There are no identifiable plans, no associated intent, mild dysphoria and related symptoms, good self-control (both objective and subjective assessment), few other risk factors, and identifiable protective factors, including available and accessible social support.  PLAN OF CARE: Patient is seen and examined.  Patient is a 24 year old male with the above-stated past psychiatric history who was admitted under involuntary commitment secondary to delusional thinking.  He will be admitted to the hospital.  He will be integrated  in the milieu.  He will be encouraged to attend groups.  We will collect collateral information from his mother, and try and track down what ever medications that he has been treated with in the past.  The PMP database reveals that he received a Xanax prescription on 11/15/2019 for 60 tablets.  That was from Dr. Evelene Croon.  We will attempt to contact her office to see what other medication she may have been treating him for.  The last prescription in our database prior to that was from Dr. Evelene Croon in December 2019.  Review of his admission laboratories showed a mildly low potassium at 3.4.  Otherwise normal electrolytes.  CBC was normal.  Acetaminophen was less than 10, salicylate was less than 7.  Blood alcohol was less than 10.  Drug screen was positive for benzodiazepines as well as marijuana.  He had an MRI done that was negative.  I will go on and write for Zyprexa tonight.  We will attempt to give him 5 mg p.o. nightly.  We will attempt given 2.5 mg right now.  Given the events of the above I will also write for the agitation protocol.  Given the benzodiazepines I will  also write for lorazepam 1 mg p.o. every 6 hours and give that to him on a standing basis just to make sure he does not go into withdrawal in the first 24 hours that he is here.  We will also giving 20 mEq of potassium to make up the difference.  I certify that inpatient services furnished can reasonably be expected to improve the patient's condition.   Antonieta Pert, MD 12/31/2019, 4:09 PM        Electronically signed by Antonieta Pert, MD at 12/31/2019 4:22 PM  ED on 11/16/2019        Detailed Report

## 2019-12-31 NOTE — BH Assessment (Signed)
Patient accepted to [facility] (adult unit) by Dr. Lucianne Muss. The attending provider is Dr. Tamera Punt. Patient assisgned to room 507-1. Nurse report [phone]. Patient's nurse Lurena Joiner notified of patient's disposition.

## 2019-12-31 NOTE — ED Notes (Addendum)
Pt noted to be lying on bed, alert. Parents are at bedside - informed [facility] reviewing pt - Parents voiced they want pt to go only to [facility] d/t father will be leaving soon to return to [place] and mother does not drive. Also requesting to speak w/provider who is prescribing meds. Mother concerned "pt will not get memory back". Dr Lucianne Muss aware and recommended for RN to speak w/Akeisha, BH. RN spoke w/Debra, [facility] - who advised they are planning to accept pt to [address] bed and he should be able to be transported this afternoon. Also advised providers may possibly speak w/parents when pt is at [facility] if needed. Parents aware and voiced agreement and understanding. Pt noted to be playing w/his mother's hair.

## 2019-12-31 NOTE — ED Notes (Signed)
Lunch ordered 

## 2019-12-31 NOTE — ED Notes (Signed)
Pt noted to be wearing hospital gown - ambulated to bathroom and back to room w/o difficulty. Pt noted to be calm, cooperative.

## 2019-12-31 NOTE — Progress Notes (Signed)
Pt visible in the dayroom interacting with peers    12/31/19 2200  Psych Admission Type (Psych Patients Only)  Admission Status Involuntary  Psychosocial Assessment  Patient Complaints Anxiety;Confusion  Eye Contact Fair  Facial Expression Anxious;Pensive;Sullen;Sad;Worried  Affect Anxious;Depressed;Sad;Sullen  Speech Logical/coherent  Interaction Assertive  Motor Activity Slow  Appearance/Hygiene In scrubs  Behavior Characteristics Cooperative  Mood Anxious  Thought Process  Coherency Concrete thinking;Disorganized  Content Obsessions;Preoccupation  Delusions Paranoid  Perception Derealization  Hallucination None reported or observed  Judgment Limited  Confusion Mild  Danger to Self  Current suicidal ideation? Denies  Danger to Others  Danger to Others None reported or observed

## 2019-12-31 NOTE — ED Notes (Signed)
[employer] transporting pt to [facility] - Parents walking out of ED w/pt per [employer]'s approval.

## 2019-12-31 NOTE — ED Notes (Signed)
Pt 's parents at bedside

## 2019-12-31 NOTE — BHH Suicide Risk Assessment (Signed)
[facility] Admission Suicide Risk Assessment   Nursing information obtained from:    Demographic factors:    Current Mental Status:    Loss Factors:    Historical Factors:    Risk Reduction Factors:     Total Time spent with patient: 45 minutes Principal Problem: <principal problem not specified> Diagnosis:  Active Problems:   * No active hospital problems. *  Subjective Data: Patient is seen and examined.  Patient is a 24 year old male who originally was placed under involuntary commitment and sent to the [facility] emergency department on 12/28/2019.  The involuntary commitment paperwork stated that the patient had recently been diagnosed with "delusional disorder".  The patient is very vague in his description of anything going on.  He apparently had been seen by Dr. Evelene Croon recently and received Xanax for flying.  He denied any auditory or visual hallucinations.  He denied any suicidal or homicidal ideation.  He denied any special powers or special abilities.  He stated to me that they had told him that he would be able to leave after a day or so.  I told him I would be unable to decide that until had a better handle of what was going on.  He asked me if his mother could pick him up when taking home.  I told him again I would have to wait and assess what was going on.  Review of the electronic medical record revealed that he had been recently prescribed Xanax and self medicates with marijuana.  He also eats edibles that have THC as a primary component.  He stated that they were only "CBD".  He did report eating edibles at least once to twice a day and also smokes marijuana, but minimized the marijuana use.  In the electronic medical record he reported that he was only getting 2 to 3 hours of sleep a night, but in my interview with him today he stated that it only happen occasionally and "everybody has trouble sleeping sometimes".  He did state that he had trouble with anxiety in the past  and "shoulder pain".  He stated that is why he started using marijuana CBD oil.  He stated that he usually lives in [place] with his father.  He stated that his father had a CBD oil business.  Other symptoms reported by collateral information from his father said that he had had a psychiatric history of the last 10 years.  He had apparently a very traumatic experience at age 21, but there were not a great deal of details of that.  His mother reported that the patient had not slept for the last 3 days, and had been delusional about his mother not being his real mother, and that she was controlled by the [employer].  There was also some situation at an airport earlier where he was attempting to leave [place] to me "[place]".  He apparently has been seeing psychiatrist over the last 10 years and had been diagnosed with a delusional disorder, but I not have any information on any of that.  He is a poor historian, and difficult to obtain history from.  He was admitted to the hospital for evaluation and stabilization.  There are notes from the emergency room visit [facility] emergency department that revealed that the patient had become increasingly agitated and pacing.  The patient requested for the nurses to call the FBI for him.  He left the emergency department as they were attempting  to medicate him with Geodon.  He avoided the police and got out of the hospital.  Police were made aware of this, and apparently picked him up and brought him to our facility.  Continued Clinical Symptoms:    The "Alcohol Use Disorders Identification Test", Guidelines for Use in Primary Care, Second Edition.  World Science writer [facility]). Score between 0-7:  no or low risk or alcohol related problems. Score between 8-15:  moderate risk of alcohol related problems. Score between 16-19:  high risk of alcohol related problems. Score 20 or above:  warrants further diagnostic evaluation for alcohol dependence and  treatment.   CLINICAL FACTORS:   Alcohol/Substance Abuse/Dependencies Currently Psychotic   Musculoskeletal: Strength & Muscle Tone: within normal limits Gait & Station: normal Patient leans: N/A  Psychiatric Specialty Exam: Physical Exam Vitals and nursing note reviewed.  Constitutional:      Appearance: Normal appearance.  HENT:     Head: Normocephalic and atraumatic.  Pulmonary:     Effort: Pulmonary effort is normal.  Neurological:     General: No focal deficit present.     Mental Status: He is alert and oriented to person, place, and time.     Review of Systems  Blood pressure 117/90, pulse 83, temperature 98.2 F (36.8 C), temperature source Oral, resp. rate 18, height 6' (1.829 m), weight 72.6 kg, SpO2 96 %.Body mass index is 21.71 kg/m.  General Appearance: Casual  Eye Contact:  Fair  Speech:  Normal Rate  Volume:  Decreased  Mood:  Anxious and Dysphoric  Affect:  Congruent  Thought Process:  Goal Directed and Descriptions of Associations: Circumstantial  Orientation:  Full (Time, Place, and Person)  Thought Content:  Delusions and Rumination  Suicidal Thoughts:  No  Homicidal Thoughts:  No  Memory:  Immediate;   Poor Recent;   Poor Remote;   Poor  Judgement:  Impaired  Insight:  Lacking  Psychomotor Activity:  Normal  Concentration:  Concentration: Fair and Attention Span: Fair  Recall:  [employer] of Knowledge:  Fair  Language:  Fair  Akathisia:  Negative  Handed:  Right  AIMS (if indicated):     Assets:  Desire for Improvement Resilience  ADL's:  Intact  Cognition:  WNL  Sleep:         COGNITIVE FEATURES THAT CONTRIBUTE TO RISK:  Closed-mindedness    SUICIDE RISK:   Mild:  Suicidal ideation of limited frequency, intensity, duration, and specificity.  There are no identifiable plans, no associated intent, mild dysphoria and related symptoms, good self-control (both objective and subjective assessment), few other risk factors, and  identifiable protective factors, including available and accessible social support.  PLAN OF CARE: Patient is seen and examined.  Patient is a 24 year old male with the above-stated past psychiatric history who was admitted under involuntary commitment secondary to delusional thinking.  He will be admitted to the hospital.  He will be integrated in the milieu.  He will be encouraged to attend groups.  We will collect collateral information from his mother, and try and track down what ever medications that he has been treated with in the past.  The PMP database reveals that he received a Xanax prescription on 11/15/2019 for 60 tablets.  That was from Dr. Evelene Croon.  We will attempt to contact her office to see what other medication she may have been treating him for.  The last prescription in our database prior to that was from Dr. Evelene Croon in December 2019.  Review of his admission laboratories showed a mildly low potassium at 3.4.  Otherwise normal electrolytes.  CBC was normal.  Acetaminophen was less than 10, salicylate was less than 7.  Blood alcohol was less than 10.  Drug screen was positive for benzodiazepines as well as marijuana.  He had an MRI done that was negative.  I will go on and write for Zyprexa tonight.  We will attempt to give him 5 mg p.o. nightly.  We will attempt given 2.5 mg right now.  Given the events of the above I will also write for the agitation protocol.  Given the benzodiazepines I will also write for lorazepam 1 mg p.o. every 6 hours and give that to him on a standing basis just to make sure he does not go into withdrawal in the first 24 hours that he is here.  We will also giving 20 mEq of potassium to make up the difference.  I certify that inpatient services furnished can reasonably be expected to improve the patient's condition.   Antonieta Pert, MD 12/31/2019, 4:09 PM

## 2019-12-31 NOTE — ED Notes (Signed)
Pt given blue scrubs to change into.

## 2019-12-31 NOTE — ED Notes (Signed)
Pt has been accepted to [facility] 507-1 - Pt's parents aware and will be coming to visit w/pt prior to pt being transported - as requested. Report has been called. ALL belongings were taken by parents prior. Pt to change into blue paper scrubs.
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
## 2019-12-31 NOTE — ED Notes (Signed)
Pt stated that he is experiencing throat pain and asked about endoscopy results. Informed Becky - RN.

## 2019-12-31 NOTE — ED Notes (Signed)
Parents leaving at this time - requested to come back to visit prior to pt being transported to [facility].
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
## 2019-12-31 NOTE — ED Notes (Signed)
Parents have arrived.
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
## 2020-01-01 ENCOUNTER — Emergency Department (HOSPITAL_COMMUNITY): Payer: BC Managed Care – PPO

## 2020-01-01 DIAGNOSIS — K625 Hemorrhage of anus and rectum: Secondary | ICD-10-CM | POA: Insufficient documentation

## 2020-01-01 LAB — URINALYSIS, ROUTINE W REFLEX MICROSCOPIC
Bilirubin Urine: NEGATIVE
Glucose, UA: NEGATIVE mg/dL
Hgb urine dipstick: NEGATIVE
Ketones, ur: 20 mg/dL — AB
Leukocytes,Ua: NEGATIVE
Nitrite: NEGATIVE
Protein, ur: 30 mg/dL — AB
Specific Gravity, Urine: 1.031 — ABNORMAL HIGH (ref 1.005–1.030)
pH: 5 (ref 5.0–8.0)

## 2020-01-01 LAB — CBC WITH DIFFERENTIAL/PLATELET
Abs Immature Granulocytes: 0 10*3/uL (ref 0.00–0.07)
Basophils Absolute: 0 10*3/uL (ref 0.0–0.1)
Basophils Relative: 1 %
Eosinophils Absolute: 0.1 10*3/uL (ref 0.0–0.5)
Eosinophils Relative: 3 %
HCT: 34.2 % — ABNORMAL LOW (ref 39.0–52.0)
Hemoglobin: 12.2 g/dL — ABNORMAL LOW (ref 13.0–17.0)
Immature Granulocytes: 0 %
Lymphocytes Relative: 26 %
Lymphs Abs: 1.3 10*3/uL (ref 0.7–4.0)
MCH: 33.6 pg (ref 26.0–34.0)
MCHC: 35.7 g/dL (ref 30.0–36.0)
MCV: 94.2 fL (ref 80.0–100.0)
Monocytes Absolute: 0.6 10*3/uL (ref 0.1–1.0)
Monocytes Relative: 13 %
Neutro Abs: 2.9 10*3/uL (ref 1.7–7.7)
Neutrophils Relative %: 57 %
Platelets: 238 10*3/uL (ref 150–400)
RBC: 3.63 MIL/uL — ABNORMAL LOW (ref 4.22–5.81)
RDW: 11.9 % (ref 11.5–15.5)
WBC: 5 10*3/uL (ref 4.0–10.5)
nRBC: 0 % (ref 0.0–0.2)

## 2020-01-01 LAB — RAPID URINE DRUG SCREEN, HOSP PERFORMED
Amphetamines: NOT DETECTED
Barbiturates: NOT DETECTED
Benzodiazepines: POSITIVE — AB
Cocaine: NOT DETECTED
Opiates: NOT DETECTED
Tetrahydrocannabinol: POSITIVE — AB

## 2020-01-01 LAB — COMPREHENSIVE METABOLIC PANEL
ALT: 20 U/L (ref 0–44)
AST: 34 U/L (ref 15–41)
Albumin: 3.8 g/dL (ref 3.5–5.0)
Alkaline Phosphatase: 39 U/L (ref 38–126)
Anion gap: 7 (ref 5–15)
BUN: 15 mg/dL (ref 6–20)
CO2: 28 mmol/L (ref 22–32)
Calcium: 8.6 mg/dL — ABNORMAL LOW (ref 8.9–10.3)
Chloride: 102 mmol/L (ref 98–111)
Creatinine, Ser: 0.79 mg/dL (ref 0.61–1.24)
GFR calc Af Amer: >60 mL/min (ref >60)
GFR calc non Af Amer: >60 mL/min (ref >60)
Glucose, Bld: 105 mg/dL — ABNORMAL HIGH (ref 70–99)
Potassium: 4 mmol/L (ref 3.5–5.1)
Sodium: 137 mmol/L (ref 135–145)
Total Bilirubin: 0.6 mg/dL (ref 0.3–1.2)
Total Protein: 6.4 g/dL — ABNORMAL LOW (ref 6.5–8.1)

## 2020-01-01 LAB — TSH: TSH: 1.332 u[IU]/mL (ref 0.350–4.500)

## 2020-01-01 LAB — LIPID PANEL
Cholesterol: 137 mg/dL (ref 0–200)
HDL: 42 mg/dL (ref >40)
LDL Cholesterol: 80 mg/dL (ref 0–99)
Total CHOL/HDL Ratio: 3.3 RATIO
Triglycerides: 77 mg/dL (ref <150)
VLDL: 15 mg/dL (ref 0–40)

## 2020-01-01 LAB — HEMOGLOBIN A1C
Hgb A1c MFr Bld: 4.6 % — ABNORMAL LOW (ref 4.8–5.6)
Mean Plasma Glucose: 85.32 mg/dL

## 2020-01-01 LAB — POC OCCULT BLOOD, ED: Fecal Occult Bld: POSITIVE — AB

## 2020-01-01 MED ORDER — OLANZAPINE 5 MG PO TBDP
15.0000 mg | ORAL_TABLET | Freq: Every day | ORAL | Status: DC
  Administered 2020-01-01: 15 mg via ORAL
  Filled 2020-01-01 (×3): qty 1

## 2020-01-01 MED ORDER — BACITRACIN-NEOMYCIN-POLYMYXIN OINTMENT TUBE
TOPICAL_OINTMENT | Freq: Two times a day (BID) | CUTANEOUS | Status: DC
  Administered 2020-01-02 – 2020-01-03 (×3): 1 via TOPICAL
  Filled 2020-01-01 (×2): qty 14.17

## 2020-01-01 NOTE — Plan of Care (Signed)
Progress note  D: pt found in the hallway; compliant with medication administration. Pt continues to be paranoid, bizarre, and preoccupied with discharge. Pt is still suspicious of medication even after continued education. Pt denies any physical complaints or pain. Pt continues to be a poor historian. Pt also still seems to be searching for the answer and/or thought blocking at times. Pt denies si/hi/ah/vh and verbally agrees to approach staff if these become apparent or before harming themself/others while at [facility].  A: Pt provided support and encouragement. Pt given medication per protocol and standing orders. Q38m safety checks implemented and continued.  R: Pt safe on the unit. Will continue to monitor.  Pt progressing in the following metrics  Problem: Education: Goal: Ability to state activities that reduce stress will improve Outcome: Progressing   Problem: Self-Concept: Goal: Level of anxiety will decrease Outcome: Progressing   Problem: Education: Goal: Knowledge of the prescribed therapeutic regimen will improve Outcome: Progressing

## 2020-01-01 NOTE — ED Triage Notes (Signed)
Pt reports 3 episodes of blood in stool starting yesterday, states he has been straining more to use bathroom, pt states he may have ingested a piece of glass while intoxicated 1 wk ago, currently admitted at [facility]

## 2020-01-01 NOTE — Progress Notes (Signed)
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, Other (Comment) Musician)  Coping Skills:   Film/video editor, Sports, TV, Music, Meditate, Talk, Prayer, Avoidance, Read, Hot Bath/Shower  Leisure Interests (2+):  Exercise - Walking, Social - Family, Social - Friends, Garment/textile technologist - Other (Comment) (Going out to eat with family/friends)  Frequency of Recreation/Participation: Weekly  Awareness of Community Resources:  Yes  Community Resources:  Park, Research scientist (physical sciences), Public affairs consultant  Current Use: Yes  If no, Barriers?:    Expressed Interest in [employer] Information: No  [employer] of Residence:  Engineer, technical sales  Patient Main Form of Transportation: Set designer  Patient Strengths:  Kindness; Caring; Respecting; Positivity  Patient Identified Areas of Improvement:  Patience  Patient Goal for Hospitalization:  "be able to be best person I can be and get back to family"  Current SI (including self-harm):  No  Current HI:  No  Current AVH: No  Staff Intervention Plan: Group Attendance, Collaborate with Interdisciplinary Treatment Team  Consent to Intern Participation: N/A    Caroll Rancher, LRT/CTRS  Lillia Abed, Alyvia Derk A 01/01/2020, 1:53 PM

## 2020-01-01 NOTE — Progress Notes (Signed)
   01/01/20 2100  Psych Admission Type (Psych Patients Only)  Admission Status Involuntary  Psychosocial Assessment  Patient Complaints Anxiety  Eye Contact Fair  Facial Expression Anxious;Pensive;Worried  Affect Anxious;Fearful;Preoccupied  Speech Logical/coherent  Interaction Assertive  Motor Activity Slow  Appearance/Hygiene In scrubs  Behavior Characteristics Cooperative  Mood Anxious  Thought Process  Coherency Concrete thinking  Content Obsessions;Preoccupation  Delusions Controlled  Perception WDL  Hallucination None reported or observed  Judgment Poor  Confusion None  Danger to Self  Current suicidal ideation? Denies  Danger to Others  Danger to Others None reported or observed

## 2020-01-01 NOTE — Progress Notes (Signed)
Pt stated he was bleeding when he had a bowel movement. Pt stated he had this issue in the past and it was an internal bleed. Pt bowel movement observed in the toilet, with a little bright red blood in toilet and bright red blood on toilet paper. NP-Adaku notified and suggested pt be sent out to the ED.

## 2020-01-01 NOTE — Progress Notes (Signed)
Recreation Therapy Notes  Date: 7.1.21 Time: 1000 Location: [address] Dayroom  Group Topic: Communication, Team Building, Problem Solving  Goal Area(s) Addresses:  Patient will effectively work with peer towards shared goal.  Patient will identify skill used to make activity successful.  Patient will identify how skills used during activity can be used to reach post d/c goals.   Behavioral Response: Engaged  Intervention: STEM Activity   Activity: In team's, using 20 small plastic cups, patients were asked to build the tallest free standing tower possible.    Education: Pharmacist, community, Building control surveyor.   Education Outcome: Acknowledges education/In group clarification offered/Needs additional education.   Clinical Observations/Feedback:  Pt was quiet but became a little more engaged when prompted by peer.  Pt was working well with group before he was called out of group to meet with residents.  Pt did not return to group.     Caroll Rancher, LRT/CTRS     Caroll Rancher A 01/01/2020 11:17 AM

## 2020-01-01 NOTE — Progress Notes (Signed)
Pt was informed he was being transported to [facility] to get checked out, pt stated he wanted to go to [facility] because his mother knows people at [facility]. "Can I request [facility]" pt informed he will be sent where the doctor feels he needs to go
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
## 2020-01-01 NOTE — ED Triage Notes (Signed)
Pt reports that he saw bright red blood in the toilet today. He reports a hx of "internal bleeding." He is a patient at [facility] and still has a bed there if discharged
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
## 2020-01-01 NOTE — Progress Notes (Signed)
This provider was called from [facility] that patient had a bright red stool X 2. Pt will be transferred to [facility] for medical clearance.

## 2020-01-02 LAB — CBC
HCT: 37.4 % — ABNORMAL LOW (ref 39.0–52.0)
Hemoglobin: 13.3 g/dL (ref 13.0–17.0)
MCH: 33 pg (ref 26.0–34.0)
MCHC: 35.6 g/dL (ref 30.0–36.0)
MCV: 92.8 fL (ref 80.0–100.0)
Platelets: 257 10*3/uL (ref 150–400)
RBC: 4.03 MIL/uL — ABNORMAL LOW (ref 4.22–5.81)
RDW: 12 % (ref 11.5–15.5)
WBC: 4.7 10*3/uL (ref 4.0–10.5)
nRBC: 0 % (ref 0.0–0.2)

## 2020-01-02 MED ORDER — LORAZEPAM 1 MG PO TABS
1.0000 mg | ORAL_TABLET | Freq: Once | ORAL | Status: DC
  Filled 2020-01-02: qty 1

## 2020-01-02 MED ORDER — OLANZAPINE 10 MG PO TBDP
20.0000 mg | ORAL_TABLET | Freq: Every day | ORAL | Status: DC
  Administered 2020-01-02 – 2020-01-04 (×3): 20 mg via ORAL
  Filled 2020-01-02 (×6): qty 2

## 2020-01-02 MED ORDER — MIRTAZAPINE 15 MG PO TABS
15.0000 mg | ORAL_TABLET | Freq: Every day | ORAL | Status: DC
  Administered 2020-01-02: 15 mg via ORAL
  Filled 2020-01-02 (×4): qty 1

## 2020-01-02 MED ORDER — ZIPRASIDONE MESYLATE 20 MG IM SOLR
10.0000 mg | Freq: Once | INTRAMUSCULAR | Status: DC
  Filled 2020-01-02: qty 20

## 2020-01-02 MED ORDER — LORAZEPAM 1 MG PO TABS: 1.0000 mg | ORAL_TABLET | Freq: Four times a day (QID) | ORAL | Status: DC | PRN

## 2020-01-02 MED ORDER — STERILE WATER FOR INJECTION IJ SOLN
INTRAMUSCULAR | Status: AC
  Filled 2020-01-02: qty 10

## 2020-01-02 NOTE — ED Notes (Signed)
[facility] called for transport for pt back to [facility]
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
## 2020-01-02 NOTE — Discharge Instructions (Addendum)
You were seen today for rectal bleeding. Your workup was reassuring. It is possible that you have had bleeding due to straining from constipation. However, we would like you to go back to GI to see them again for further recommendations. In the meantime, please make sure you are eating a healthy, high fiber diet and drinking plenty of water.

## 2020-01-02 NOTE — Tx Team (Signed)
Interdisciplinary Treatment and Diagnostic Plan Update  01/02/2020 Time of Session: 9:15am Chase Mcneil MRN: [id number]  Principal Diagnosis: <principal problem not specified>  Secondary Diagnoses: Active Problems:   Acute psychosis ([place])   Current Medications:  Current Facility-Administered Medications  Medication Dose Route Frequency Provider Last Rate Last Admin  . acetaminophen (TYLENOL) tablet 650 mg  650 mg Oral Q6H PRN Sharma Covert, MD   650 mg at 01/01/20 1748  . alum & mag hydroxide-simeth (MAALOX/MYLANTA) 200-200-20 MG/5ML suspension 30 mL  30 mL Oral Q4H PRN Sharma Covert, MD      . hydrOXYzine (ATARAX/VISTARIL) tablet 25 mg  25 mg Oral TID PRN Sharma Covert, MD      . OLANZapine zydis (ZYPREXA) disintegrating tablet 5 mg  5 mg Oral Q8H PRN Sharma Covert, MD       And  . LORazepam (ATIVAN) tablet 1 mg  1 mg Oral PRN Sharma Covert, MD       And  . ziprasidone (GEODON) injection 20 mg  20 mg Intramuscular PRN Sharma Covert, MD      . LORazepam (ATIVAN) tablet 1 mg  1 mg Oral TID Sharma Covert, MD   1 mg at 01/01/20 1132  . magnesium hydroxide (MILK OF MAGNESIA) suspension 30 mL  30 mL Oral Daily PRN Sharma Covert, MD      . neomycin-bacitracin-polymyxin (NEOSPORIN) ointment   Topical BID Sharma Covert, MD   Given at 01/01/20 1613  . OLANZapine zydis (ZYPREXA) disintegrating tablet 15 mg  15 mg Oral QHS Sharma Covert, MD   15 mg at 01/01/20 2043  . OLANZapine zydis (ZYPREXA) disintegrating tablet 5 mg  5 mg Oral Daily Sharma Covert, MD   5 mg at 01/01/20 0732  . potassium chloride SA (KLOR-CON) CR tablet 20 mEq  20 mEq Oral Once Sharma Covert, MD      . traZODone (DESYREL) tablet 50 mg  50 mg Oral QHS PRN Sharma Covert, MD       PTA Medications: Medications Prior to Admission  Medication Sig Dispense Refill Last Dose  . ALPRAZolam (XANAX) 1 MG tablet Take 1 mg by mouth 2 (two) times daily as needed (panic  attacks).      Marland Kitchen ascorbic acid (VITAMIN C) 500 MG tablet Take 500-1,000 mg by mouth daily.     Marland Kitchen aspirin EC 325 MG tablet Take 325 mg by mouth every other day.     . calcium carbonate (TUMS - DOSED IN MG ELEMENTAL CALCIUM) 500 MG chewable tablet Chew 1-2 tablets by mouth daily as needed for indigestion or heartburn.     . co-enzyme Q-10 30 MG capsule Take 30 mg by mouth 3 (three) times daily.     Marland Kitchen omeprazole (PRILOSEC) 40 MG capsule Take 40 mg by mouth daily as needed (acid reflux/indigestion).      Marland Kitchen OVER THE COUNTER MEDICATION Take 2 capsules by mouth 3 (three) times daily as needed. "Sea Moss" capsules     . valACYclovir (VALTREX) 500 MG tablet Take 500 mg by mouth daily.       Patient Stressors: Financial difficulties Health problems Occupational concerns Traumatic event  Patient Strengths: Ability for insight Communication skills Motivation for treatment/growth Physical Health Supportive family/friends Work skills  Treatment Modalities: Medication Management, Group therapy, Case management,  1 to 1 session with clinician, Psychoeducation, Recreational therapy.   Physician Treatment Plan for Primary Diagnosis: <principal problem not specified> Long Term  Goal(s): Improvement in symptoms so as ready for discharge Improvement in symptoms so as ready for discharge   Short Term Goals: Ability to identify changes in lifestyle to reduce recurrence of condition will improve Ability to verbalize feelings will improve Ability to disclose and discuss suicidal ideas Ability to demonstrate self-control will improve Ability to identify and develop effective coping behaviors will improve Ability to maintain clinical measurements within normal limits will improve Compliance with prescribed medications will improve Ability to identify triggers associated with substance abuse/mental health issues will improve Ability to identify changes in lifestyle to reduce recurrence of condition will  improve Ability to verbalize feelings will improve Ability to disclose and discuss suicidal ideas Ability to demonstrate self-control will improve Ability to identify and develop effective coping behaviors will improve Ability to maintain clinical measurements within normal limits will improve Compliance with prescribed medications will improve Ability to identify triggers associated with substance abuse/mental health issues will improve  Medication Management: Evaluate patient's response, side effects, and tolerance of medication regimen.  Therapeutic Interventions: 1 to 1 sessions, Unit Group sessions and Medication administration.  Evaluation of Outcomes: Not Met  Physician Treatment Plan for Secondary Diagnosis: Active Problems:   Acute psychosis ([place])  Long Term Goal(s): Improvement in symptoms so as ready for discharge Improvement in symptoms so as ready for discharge   Short Term Goals: Ability to identify changes in lifestyle to reduce recurrence of condition will improve Ability to verbalize feelings will improve Ability to disclose and discuss suicidal ideas Ability to demonstrate self-control will improve Ability to identify and develop effective coping behaviors will improve Ability to maintain clinical measurements within normal limits will improve Compliance with prescribed medications will improve Ability to identify triggers associated with substance abuse/mental health issues will improve Ability to identify changes in lifestyle to reduce recurrence of condition will improve Ability to verbalize feelings will improve Ability to disclose and discuss suicidal ideas Ability to demonstrate self-control will improve Ability to identify and develop effective coping behaviors will improve Ability to maintain clinical measurements within normal limits will improve Compliance with prescribed medications will improve Ability to identify triggers associated with substance  abuse/mental health issues will improve     Medication Management: Evaluate patient's response, side effects, and tolerance of medication regimen.  Therapeutic Interventions: 1 to 1 sessions, Unit Group sessions and Medication administration.  Evaluation of Outcomes: Not Met   RN Treatment Plan for Primary Diagnosis: <principal problem not specified> Long Term Goal(s): Knowledge of disease and therapeutic regimen to maintain health will improve  Short Term Goals: Ability to remain free from injury will improve, Ability to verbalize frustration and anger appropriately will improve, Ability to participate in decision making will improve and Compliance with prescribed medications will improve  Medication Management: RN will administer medications as ordered by provider, will assess and evaluate patient's response and provide education to patient for prescribed medication. RN will report any adverse and/or side effects to prescribing provider.  Therapeutic Interventions: 1 on 1 counseling sessions, Psychoeducation, Medication administration, Evaluate responses to treatment, Monitor vital signs and CBGs as ordered, Perform/monitor CIWA, COWS, AIMS and Fall Risk screenings as ordered, Perform wound care treatments as ordered.  Evaluation of Outcomes: Not Met   LCSW Treatment Plan for Primary Diagnosis: <principal problem not specified> Long Term Goal(s): Safe transition to appropriate next level of care at discharge, Engage patient in therapeutic group addressing interpersonal concerns.  Short Term Goals: Engage patient in aftercare planning with referrals and resources, Increase social  support, Identify triggers associated with mental health/substance abuse issues and Increase skills for wellness and recovery  Therapeutic Interventions: Assess for all discharge needs, 1 to 1 time with Social worker, Explore available resources and support systems, Assess for adequacy in community support  network, Educate family and significant other(s) on suicide prevention, Complete Psychosocial Assessment, Interpersonal group therapy.  Evaluation of Outcomes: Not Met   Progress in Treatment: Attending groups: No. Participating in groups: No. Taking medication as prescribed: Yes. Toleration medication: Yes. Family/Significant other contact made: No, will contact:  if consents are provided. Patient understands diagnosis: Yes. Discussing patient identified problems/goals with staff: Yes. Medical problems stabilized or resolved: No. Denies suicidal/homicidal ideation: Yes. Issues/concerns per patient self-inventory: No. Other: none  New problem(s) identified: No, Describe:  CSW will continue to assess.  New Short Term/Long Term Goal(s): medication stabilization, elimination of SI thoughts, development of comprehensive mental wellness plan.   Patient Goals:  Did not attend  Discharge Plan or Barriers: Patient recently admitted. CSW will continue to follow and assess for appropriate referrals and possible discharge planning.   Reason for Continuation of Hospitalization: Delusions  Medical Issues Medication stabilization  Estimated Length of Stay: 3-5 days  Attendees: Patient: Did not attend 01/02/2020   Physician: Myles Lipps, MD 01/02/2020   Nursing:  01/02/2020   RN Care Manager: 01/02/2020  Social Worker: Darletta Moll, [place] 01/02/2020   Recreational Therapist:  01/02/2020   Other:  01/02/2020   Other:  01/02/2020   Other: 01/02/2020     Scribe for Treatment Team: Vassie Moselle, [place] 01/02/2020 9:21 AM

## 2020-01-02 NOTE — ED Notes (Signed)
Pt offered PO ativan, updated on plan of care and awaiting repeat labs. Asked pt to remain in room.

## 2020-01-02 NOTE — Progress Notes (Signed)
Pt attended spirituality group facilitated by Wilkie Aye, MDIv, BCC.  Group Description: Group focused on topic of hope. Patients participated in facilitated discussion around topic, connecting with one another around experiences and definitions for hope. Group members engaged with visual explorer photos, reflecting on what hope looks like for them today. Group engaged in discussion around how their definitions of hope are present today in hospital.  Modalities: Psycho-social ed, Adlerian, Narrative, MI  Patient Progress: Chase Mcneil was present throughout group with exception of leaving to use restroom and returning.  Engaged in group when prompted by facilitator.  Noted "holding on to gratefulness" as a part of his hope.  He connected with another group member around things that help him keep this perspective and he named "prayer."  At end of group when thinking of what he is hopeful for today, he states: "staying alive... I have a number of illnesses that can take my life and I hope I get to stay alive."

## 2020-01-02 NOTE — ED Notes (Signed)
Pt in room with sitter. Given water

## 2020-01-02 NOTE — ED Notes (Signed)
Pt ambulating in hallway, pt asked to go back to room.

## 2020-01-02 NOTE — Progress Notes (Signed)
Pt continues to be somatic and paranoid    01/02/20 2200  Psych Admission Type (Psych Patients Only)  Admission Status Involuntary  Psychosocial Assessment  Patient Complaints Anxiety  Eye Contact Fair  Facial Expression Anxious;Pensive;Worried  Affect Anxious;Fearful;Preoccupied  Speech Logical/coherent  Interaction Assertive  Motor Activity Slow  Appearance/Hygiene In scrubs  Behavior Characteristics Cooperative  Mood Anxious  Thought Process  Coherency Concrete thinking  Content Obsessions;Preoccupation  Delusions Controlled  Perception WDL  Hallucination None reported or observed  Judgment Poor  Confusion None  Danger to Self  Current suicidal ideation? Denies  Danger to Others  Danger to Others None reported or observed

## 2020-01-02 NOTE — Progress Notes (Signed)
Per ED Notes:   [facility]EMERGENCY DEPT Provider Note     CSN: [id number] Arrival date & time: 01/01/20  2206     History    Chief Complaint  Patient presents with   Rectal Bleeding      Chase Mcneil is a 24 y.o. male.   Patient is a 24 year old male who is currently a patient at the behavioral health hospital after being IVC is for delusional disorder who presents emergency department for blood in the stool.  Patient reports that he noticed he had bright red blood in his stool yesterday as well as 2 times today.  He reports that he had not had a bowel movement for several days and then had one today which caused him to have to strain and was hard to pass.  He denies any fever, chills, abdominal pain, lightheadedness currently but does report that he occasionally has abdominal pain and he is concerned about his liver.  He also reports that about a week ago he was drinking a bottle of beer when some of the bottle broke off and he thinks he might of swallowed glass and he is concerned that this is causing internal bleeding.  ED Course  I have reviewed the triage vital signs and the nursing notes.   Pertinent labs & imaging results that were available during my care of the patient were reviewed by me and considered in my medical decision making (see chart for details).   Clinical Course as of Jan 02 428  Fri Jan 02, 2020  0033 Patient presenting with 1 days of BRBPR. Reports he was constipated and strained and then had 3 episodes of blood in the toilet and on the toilet paper. Hx of the same in the past and had negative colonoscopy. +specs of blood in brown stool on my exam. Not orthostatic. However, patient did have a hemoglobin of 16.9 5 days ago and it is now 12.2. He reports ? Of swallowing glass so a plain film abdomen was ordered. Will monitor patient and repeat CBC. If stable, may go home. Discussed with Dr. Eudelia Bunch and plan agreed upon   [KM]  0203 Patient  begins to become more agitated, anxious.  He refused p.o. Ativan.  We will try IM Geodon as he has done well with this with behavioral health. Currently under IVC   [KM]  0425 Patient's hemoglobin has actually improved. I think his sx are likely due to straining from constipation. Will advise patient to make dietary changes and drink plenty of water but will also refer back to GI for possible repeat colonoscopy .    [KM]     Clinical Course User Index [KM] Jeral Pinch    MDM Rules/Calculators/A&P                           Based on review of vitals, medical screening exam, lab work and/or imaging, there does not appear to be an acute, emergent etiology for the patient's symptoms. Counseled pt on good return precautions and encouraged both PCP and ED follow-up as needed.   Prior to discharge, I also discussed incidental imaging findings with patient in detail and advised appropriate, recommended follow-up in detail.   Clinical Impression: 1. Rectal bleeding       Disposition: Discharge   Prior to providing a prescription for a controlled substance, I independently reviewed the patient's recent prescription history on the [place] Controlled Substance  Reporting System. The patient had no recent or regular prescriptions and was deemed appropriate for a brief, less than 3 day prescription of narcotic for acute analgesia.   This note was prepared with assistance of Conservation officer, historic buildings. Occasional wrong-word or sound-a-like substitutions may have occurred due to the inherent limitations of voice recognition software.   Final Clinical Impression(s) / ED Diagnoses Final diagnoses:  Rectal bleeding      Rx / DC Orders    ED Discharge Orders     None         Arlyn Dunning, PA-C 01/02/20 [phone]
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
## 2020-01-02 NOTE — [facility] Suicide Risk Assessment (Signed)
[facility] INPATIENT:  Family/Significant Other Suicide Prevention Education    Suicide Prevention Education:  Patient Refusal for Family/Significant Other Suicide Prevention Education: The patient Chase Mcneil has refused to provide written consent for family/significant other to be provided Family/Significant Other Suicide Prevention Education during admission and/or prior to discharge.  Physician notified.   SPE completed with patient, as patient refused to consent to family contact. SPI pamphlet provided to pt and pt was encouraged to share information with support network, ask questions, and talk about any concerns relating to SPE. Patient denies access to guns/firearms and verbalized understanding of information provided. Mobile Crisis information also provided to patient.   Ruthann Cancer MSW, [employer] Clincal Social Worker  [facility]

## 2020-01-03 MED ORDER — MIRTAZAPINE 30 MG PO TABS
30.0000 mg | ORAL_TABLET | Freq: Every day | ORAL | Status: DC
  Filled 2020-01-03 (×4): qty 1
  Filled 2020-01-03: qty 2
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
## 2020-01-03 NOTE — Progress Notes (Signed)
   01/02/20 2200  Psych Admission Type (Psych Patients Only)  Admission Status Involuntary  Psychosocial Assessment  Patient Complaints Anxiety  Eye Contact Fair  Facial Expression Anxious;Pensive;Worried  Affect Anxious;Fearful;Preoccupied  Speech Logical/coherent  Interaction Assertive  Motor Activity Slow  Appearance/Hygiene In scrubs  Behavior Characteristics Cooperative  Mood Anxious  Thought Process  Coherency Concrete thinking  Content Obsessions;Preoccupation  Delusions Controlled  Perception WDL  Hallucination None reported or observed  Judgment Poor  Confusion None  Danger to Self  Current suicidal ideation? Denies  Danger to Others  Danger to Others None reported or observed
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
## 2020-01-03 NOTE — Progress Notes (Signed)
Patient has been isolative to his room tonight. He did come out for his snack and medication. He declined taking his Restoril reporting that he doesn't have trouble sleeping. Safety maintained with 15 min checks.

## 2020-01-04 NOTE — Progress Notes (Signed)
Patient did not attend wrap up group because he was asleep,

## 2020-01-04 NOTE — Progress Notes (Signed)
Patient denies SI, HI and AVH.  Patient has had no behavioral dyscontrol and has been compliant with medications this shift.   Assess patient for safety, offer medications as prescribed, engage patient in 1:1 therapeutic staff talks.   Continue to monitor as planned.  Patient able to contract for safety.

## 2020-01-04 NOTE — Progress Notes (Signed)
[facility] MD Progress Note  01/04/2020 1:03 PM Chase Mcneil  MRN:  [id number] Subjective:  Patient is a 24 year old male originally placed under involuntary commitment and sent to the [facility] emergency department on 12/28/2019.  The patient had recently been diagnosed with "delusional disorder".  He appeared to be very vague, paranoid.  He escaped from the emergency department, and had to be brought back by police.  There was a self-inflicted wound on his neck that was thought to be an attempted suicide as well.  He was admitted to the hospital for evaluation and stabilization.  Objective: Patient is seen and examined.  Patient is a 24 year old male with the above-stated past psychiatric history is seen in follow-up.  He continues to be vague and minimize everything is taken place.  I asked him whether or not he had any explanation as to why he would cut his throat what his intentions were.  He returned his comments back to the "I really do not remember".  He continues to have intermittent times of tearfulness where he states he "misses my family".  He did state that the daytime dosage of Zyprexa is sedating, and requested that it be decreased.  He denied any auditory or visual hallucinations.  He denied any suicidal or homicidal ideation.  His delusional thinking remains, but again he remains vague on that and will go into detail.  His vital signs are stable, he is afebrile.  He slept 6.75 hours last night.  No new laboratories.  Principal Problem: <principal problem not specified> Diagnosis: Active Problems:   Acute psychosis (HCC)  Total Time spent with patient: 20 minutes  Past Psychiatric History: See admission H&P  Past Medical History: History reviewed. No pertinent past medical history.  Past Surgical History:  Procedure Laterality Date  . ESOPHAGOGASTRODUODENOSCOPY N/A 12/29/2019   Procedure: ESOPHAGOGASTRODUODENOSCOPY (EGD);  Surgeon: Diamantina Monks, MD;  Location: [facility]  ENDOSCOPY;  Service: General;  Laterality: N/A;   Family History: History reviewed. No pertinent family history. Family Psychiatric  History: See admission H&P Social History:  Social History   Substance and Sexual Activity  Alcohol Use Yes   Comment: occ     Social History   Substance and Sexual Activity  Drug Use Yes  . Types: Marijuana   Comment: "in last month"    Social History   Socioeconomic History  . Marital status: Single    Spouse name: Not on file  . Number of children: Not on file  . Years of education: Not on file  . Highest education level: Not on file  Occupational History  . Not on file  Tobacco Use  . Smoking status: Light Tobacco Smoker    Types: Cigarettes  . Smokeless tobacco: Never Used  Vaping Use  . Vaping Use: Never used  Substance and Sexual Activity  . Alcohol use: Yes    Comment: occ  . Drug use: Yes    Types: Marijuana    Comment: "in last month"  . Sexual activity: Not Currently  Other Topics Concern  . Not on file  Social History Narrative  . Not on file   Social Determinants of Health   Financial Resource Strain:   . Difficulty of Paying Living Expenses:   Food Insecurity:   . Worried About Programme researcher, broadcasting/film/video in the Last Year:   . Barista in the Last Year:   Transportation Needs:   . Freight forwarder (Medical):   Marland Kitchen Lack  of Transportation (Non-Medical):   Physical Activity:   . Days of Exercise per Week:   . Minutes of Exercise per Session:   Stress:   . Feeling of Stress :   Social Connections:   . Frequency of Communication with Friends and Family:   . Frequency of Social Gatherings with Friends and Family:   . Attends Religious Services:   . Active Member of Clubs or Organizations:   . Attends Banker Meetings:   Marland Kitchen Marital Status:    Additional Social History:                         Sleep: Good  Appetite:  Good  Current Medications: Current Facility-Administered  Medications  Medication Dose Route Frequency Provider Last Rate Last Admin  . acetaminophen (TYLENOL) tablet 650 mg  650 mg Oral Q6H PRN Antonieta Pert, MD   650 mg at 01/01/20 1748  . alum & mag hydroxide-simeth (MAALOX/MYLANTA) 200-200-20 MG/5ML suspension 30 mL  30 mL Oral Q4H PRN Antonieta Pert, MD      . hydrOXYzine (ATARAX/VISTARIL) tablet 25 mg  25 mg Oral TID PRN Antonieta Pert, MD      . OLANZapine zydis (ZYPREXA) disintegrating tablet 5 mg  5 mg Oral Q8H PRN Antonieta Pert, MD       And  . LORazepam (ATIVAN) tablet 1 mg  1 mg Oral PRN Antonieta Pert, MD       And  . ziprasidone (GEODON) injection 20 mg  20 mg Intramuscular PRN Antonieta Pert, MD      . magnesium hydroxide (MILK OF MAGNESIA) suspension 30 mL  30 mL Oral Daily PRN Antonieta Pert, MD      . mirtazapine (REMERON) tablet 30 mg  30 mg Oral QHS Antonieta Pert, MD      . neomycin-bacitracin-polymyxin (NEOSPORIN) ointment   Topical BID Antonieta Pert, MD   Given at 01/04/20 0805  . OLANZapine zydis (ZYPREXA) disintegrating tablet 20 mg  20 mg Oral QHS Antonieta Pert, MD   20 mg at 01/03/20 2117  . potassium chloride SA (KLOR-CON) CR tablet 20 mEq  20 mEq Oral Once Antonieta Pert, MD      . traZODone (DESYREL) tablet 50 mg  50 mg Oral QHS PRN Antonieta Pert, MD        Lab Results: No results found for this or any previous visit (from the past 48 hour(s)).  Blood Alcohol level:  Lab Results  Component Value Date   ETH <10 12/29/2019    Metabolic Disorder Labs: Lab Results  Component Value Date   HGBA1C 4.6 (L) 01/01/2020   MPG 85.32 01/01/2020   No results found for: PROLACTIN Lab Results  Component Value Date   CHOL 137 01/01/2020   TRIG 77 01/01/2020   HDL 42 01/01/2020   CHOLHDL 3.3 01/01/2020   VLDL 15 01/01/2020   LDLCALC 80 01/01/2020    Physical Findings: AIMS: Facial and Oral Movements Muscles of Facial Expression: None, normal Lips and Perioral  Area: None, normal Jaw: None, normal Tongue: None, normal,Extremity Movements Upper (arms, wrists, hands, fingers): None, normal Lower (legs, knees, ankles, toes): None, normal, Trunk Movements Neck, shoulders, hips: None, normal, Overall Severity Severity of abnormal movements (highest score from questions above): None, normal Incapacitation due to abnormal movements: None, normal Patient's awareness of abnormal movements (rate only patient's report): No Awareness, Dental Status Current problems with teeth  and/or dentures?: No Does patient usually wear dentures?: No  CIWA:    COWS:     Musculoskeletal: Strength & Muscle Tone: within normal limits Gait & Station: normal Patient leans: N/A  Psychiatric Specialty Exam: Physical Exam Vitals and nursing note reviewed.  Constitutional:      Appearance: Normal appearance.  HENT:     Head: Normocephalic and atraumatic.  Pulmonary:     Effort: Pulmonary effort is normal.  Neurological:     General: No focal deficit present.     Mental Status: He is alert and oriented to person, place, and time.     Review of Systems  Blood pressure 115/66, pulse 82, temperature 97.6 F (36.4 C), temperature source Oral, resp. rate 18, height 5' 8.75" (1.746 m), weight 67.6 kg, SpO2 98 %.Body mass index is 22.16 kg/m.  General Appearance: Casual  Eye Contact:  Good  Speech:  Normal Rate  Volume:  Normal  Mood:  Anxious  Affect:  Congruent  Thought Process:  Coherent and Descriptions of Associations: Circumstantial  Orientation:  Full (Time, Place, and Person)  Thought Content:  Delusions  Suicidal Thoughts:  No  Homicidal Thoughts:  No  Memory:  Immediate;   Poor Recent;   Fair Remote;   Fair  Judgement:  Intact  Insight:  Lacking  Psychomotor Activity:  Increased  Concentration:  Concentration: Fair and Attention Span: Fair  Recall:  [employer] of Knowledge:  Fair  Language:  Fair  Akathisia:  Negative  Handed:  Right  AIMS (if  indicated):     Assets:  Desire for Improvement Housing Resilience  ADL's:  Intact  Cognition:  WNL  Sleep:  Number of Hours: 6.75     Treatment Plan Summary: Daily contact with patient to assess and evaluate symptoms and progress in treatment, Medication management and Plan : Patient seen and examined.  Patient is a 24 year old male with the above-stated past psychiatric history who is seen in follow-up.   Diagnosis: 1.  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  Cannabis dependence  Pertinent findings on examination today: 1.  Slight recognition of bizarre delusional thinking. 2.  Still very vague about his symptoms and the cause of his suicidal ideation. 3.  Denies current suicidality, homicidality, psychosis or auditory or visual hallucinations. 4.  Stated that he is "not depressed or anxious currently".  He still appears to be anxious.  Plan: 1.  Continue mirtazapine 15 mg p.o. nightly for anxiety and depression. 2.  Continue Zyprexa Zydis 20 mg p.o. nightly, but stop daytime Zyprexa secondary to oversedation.  This is for psychosis and mood stability. 3.  Continue trazodone 50 mg p.o. nightly as needed insomnia. 4.  Disposition planning-in progress.  Antonieta Pert, MD 01/04/2020, 1:03 PM

## 2020-01-05 MED ORDER — OLANZAPINE 20 MG PO TBDP: 20.0000 mg | ORAL_TABLET | Freq: Every day | ORAL | 0 refills | Status: DC

## 2020-01-05 MED ORDER — MIRTAZAPINE 30 MG PO TABS: 30.0000 mg | ORAL_TABLET | Freq: Every day | ORAL | 0 refills | Status: DC

## 2020-01-05 NOTE — Progress Notes (Signed)
[facility] MD Progress Note  01/05/2020 10:42 AM Chase Mcneil  MRN:  [id number] Subjective:Pt was admitted involuntary by his mom due to bizarre delusions about flying to meet Marvis Repress. Pt was admitted to the inpatient psychiatry unit for Bizarre delusions and questionable self harm behavior.Urine Toxicology screen positive for Benzodiazepines and Tetrahydrocannabinol.  Pt was seen and examined today. Today Pt is feeling better and says his mood and anxiety is much better. Currently, Pt denies any suicidal ideation, homicidal ideation and, visual and auditory hallucination . Pt denies any paranoia. Pt denies any changes in appetite and slept well last night. Pt states he feels drowsy sometimes because of medications. Pt states his energy is better and he doesn't feel fatigue. Pt slept for 7 hrs last night. Pt denies decreased concentration, increased self esteem, or any unusual powers. Pt is hopeful for the future and says he will stay away from drugs. Pt states he misses his family very much. Pt denies any side effects from medications except drowsiness sometimes. Pt will be discharged today as he is stable. Dad was called and he is hopeful for his future and said he will keep track of his outpatient appointments and Mom is going to keep track of Medications. Pt is on Remeron 30 mg, Zyprexa 20 mg HS.  Principal Problem: <principal problem not specified> Diagnosis: Active Problems:   Acute psychosis (HCC)  Total Time spent with patient: 20 minutes  Past Psychiatric History: Delusional Disorder (According to Mom)  Past Medical History: History reviewed. No pertinent past medical history.  Past Surgical History:  Procedure Laterality Date  . ESOPHAGOGASTRODUODENOSCOPY N/A 12/29/2019   Procedure: ESOPHAGOGASTRODUODENOSCOPY (EGD);  Surgeon: Diamantina Monks, MD;  Location: [facility] ENDOSCOPY;  Service: General;  Laterality: N/A;   Family History: History reviewed. No pertinent family history. Family  Psychiatric  History: None Social History:  Social History   Substance and Sexual Activity  Alcohol Use Yes   Comment: occ     Social History   Substance and Sexual Activity  Drug Use Yes  . Types: Marijuana   Comment: "in last month"    Social History   Socioeconomic History  . Marital status: Single    Spouse name: Not on file  . Number of children: Not on file  . Years of education: Not on file  . Highest education level: Not on file  Occupational History  . Not on file  Tobacco Use  . Smoking status: Light Tobacco Smoker    Types: Cigarettes  . Smokeless tobacco: Never Used  Vaping Use  . Vaping Use: Never used  Substance and Sexual Activity  . Alcohol use: Yes    Comment: occ  . Drug use: Yes    Types: Marijuana    Comment: "in last month"  . Sexual activity: Not Currently  Other Topics Concern  . Not on file  Social History Narrative  . Not on file   Social Determinants of Health   Financial Resource Strain:   . Difficulty of Paying Living Expenses:   Food Insecurity:   . Worried About Programme researcher, broadcasting/film/video in the Last Year:   . Barista in the Last Year:   Transportation Needs:   . Freight forwarder (Medical):   Marland Kitchen Lack of Transportation (Non-Medical):   Physical Activity:   . Days of Exercise per Week:   . Minutes of Exercise per Session:   Stress:   . Feeling of Stress :   Social Connections:   .  Frequency of Communication with Friends and Family:   . Frequency of Social Gatherings with Friends and Family:   . Attends Religious Services:   . Active Member of Clubs or Organizations:   . Attends Banker Meetings:   Marland Kitchen Marital Status:    Additional Social History:                         Sleep: Good  Appetite:  Good  Current Medications: Current Facility-Administered Medications  Medication Dose Route Frequency Provider Last Rate Last Admin  . acetaminophen (TYLENOL) tablet 650 mg  650 mg Oral Q6H PRN  Antonieta Pert, MD   650 mg at 01/01/20 1748  . alum & mag hydroxide-simeth (MAALOX/MYLANTA) 200-200-20 MG/5ML suspension 30 mL  30 mL Oral Q4H PRN Antonieta Pert, MD      . hydrOXYzine (ATARAX/VISTARIL) tablet 25 mg  25 mg Oral TID PRN Antonieta Pert, MD      . OLANZapine zydis (ZYPREXA) disintegrating tablet 5 mg  5 mg Oral Q8H PRN Antonieta Pert, MD       And  . LORazepam (ATIVAN) tablet 1 mg  1 mg Oral PRN Antonieta Pert, MD       And  . ziprasidone (GEODON) injection 20 mg  20 mg Intramuscular PRN Antonieta Pert, MD      . magnesium hydroxide (MILK OF MAGNESIA) suspension 30 mL  30 mL Oral Daily PRN Antonieta Pert, MD      . mirtazapine (REMERON) tablet 30 mg  30 mg Oral QHS Antonieta Pert, MD      . neomycin-bacitracin-polymyxin (NEOSPORIN) ointment   Topical BID Antonieta Pert, MD   Given at 01/04/20 1609  . OLANZapine zydis (ZYPREXA) disintegrating tablet 20 mg  20 mg Oral QHS Antonieta Pert, MD   20 mg at 01/04/20 2123  . potassium chloride SA (KLOR-CON) CR tablet 20 mEq  20 mEq Oral Once Antonieta Pert, MD      . traZODone (DESYREL) tablet 50 mg  50 mg Oral QHS PRN Antonieta Pert, MD        Lab Results: No results found for this or any previous visit (from the past 48 hour(s)).  Blood Alcohol level:  Lab Results  Component Value Date   ETH <10 12/29/2019    Metabolic Disorder Labs: Lab Results  Component Value Date   HGBA1C 4.6 (L) 01/01/2020   MPG 85.32 01/01/2020   No results found for: PROLACTIN Lab Results  Component Value Date   CHOL 137 01/01/2020   TRIG 77 01/01/2020   HDL 42 01/01/2020   CHOLHDL 3.3 01/01/2020   VLDL 15 01/01/2020   LDLCALC 80 01/01/2020    Physical Findings: AIMS: Facial and Oral Movements Muscles of Facial Expression: None, normal Lips and Perioral Area: None, normal Jaw: None, normal Tongue: None, normal,Extremity Movements Upper (arms, wrists, hands, fingers): None, normal Lower  (legs, knees, ankles, toes): None, normal, Trunk Movements Neck, shoulders, hips: None, normal, Overall Severity Severity of abnormal movements (highest score from questions above): None, normal Incapacitation due to abnormal movements: None, normal Patient's awareness of abnormal movements (rate only patient's report): No Awareness, Dental Status Current problems with teeth and/or dentures?: No Does patient usually wear dentures?: No  CIWA:    COWS:     Musculoskeletal: Strength & Muscle Tone: within normal limits Gait & Station: normal Patient leans: N/A  Psychiatric Specialty Exam: Physical Exam  Review of Systems  Blood pressure 115/66, pulse 82, temperature 97.6 F (36.4 C), temperature source Oral, resp. rate 18, height 5' 8.75" (1.746 m), weight 67.6 kg, SpO2 98 %.Body mass index is 22.16 kg/m.  General Appearance: Casual  Eye Contact:  Good  Speech:  Clear and Coherent  Volume:  Normal  Mood:  Euthymic  Affect:  Appropriate  Thought Process:  Coherent  Orientation:  Full (Time, Place, and Person)  Thought Content:  Negative  Suicidal Thoughts:  No  Homicidal Thoughts:  No  Memory:  Immediate;   Good  Judgement:  Fair  Insight:  Fair  Psychomotor Activity:  Negative  Concentration:  Concentration: Fair  Recall:  Fair  Fund of Knowledge:  Good  Language:  Good  Akathisia:  No  Handed:  Left  AIMS (if indicated):     Assets:  Desire for Improvement Social Support  ADL's:  Intact  Cognition:  WNL  Sleep:  Number of Hours: 6.5     Treatment Plan Summary: Pt was admitted with the above mentioned psychiatric history. Today Pt is feeling better and says his mood and anxiety is much better. Currently, Pt denies any suicidal ideation, homicidal ideation and, visual and auditory hallucination . Pt denies any paranoia. Pt denies any changes in appetite and slept well last night. Pt states he feels drowsy sometimes because of medications. Pt states his energy is better  and he doesn't feel fatigue. Pt slept for 7 hrs last night. Pt denies decreased concentration, increased self esteem, or any unusual powers. Pt is hopeful for the future and says he will stay away from drugs. Pt states he misses his family very much. Pt denies any side effects from medications except drowsiness sometimes. Pt will be discharged today as he is stable. Pt was encouraged to set up goals for himself and keep track of those goals. Pt was encouraged to continue all his medications and follow up in the outpatient department.  Dad and Mom was called and they are hopeful for his future and said they will keep track of his outpatient appointments and Mom is going to keep track of Medications. Currently, Pt is on Remeron 30 mg, and Zyprexa 20 mg HS. Mom prefers to follow up at [facility].   Discharge medications-  -Remeron 15 mg OD -Zyprexa 20 mg HS - Trazodone 50 mg nightly as needed for Sleep.     Karsten Ro, MD 01/05/2020, 10:42 AM

## 2020-01-05 NOTE — Progress Notes (Signed)
Pt has been isolative the whole evening, only got up for meds and snacks and went back to bed. Pt stated some of the medications are making too sleepy. Pt denied SI/HI and contacted for safety. Support and encouragement offered as needed, will continue to monitor.

## 2020-01-05 NOTE — Progress Notes (Signed)
  [facility] Adult Case Management Discharge Plan :  Will you be returning to the same living situation after discharge:  Yes,  return home with parents. At discharge, do you have transportation home?: Yes,  with parents. Do you have the ability to pay for your medications: Yes,  Pt has coverage via [employer].  Release of information consent forms completed and in the chart; emailed to Medical Records, then turned in to Medical Records by CSW.    Patient to Follow up at:  Follow-up Information    Milagros Evener, MD Follow up on 01/28/2020.   Specialty: Psychiatry Why: You have an appointment on 01/28/20 at 4:45 pm with Dr. Evelene Croon for medication management.  This will be a Virtual appointment.  If you are interested in therapy services with this provider, please contact them upon discharge. Contact information: [address] [place] [address] [phone]               Next level of care provider has access to [facility] Link:yes  Safety Planning and Suicide Prevention discussed: Yes,  completed with pt, pt declined contact to supports. Pt given pamphlet at time of discharge.     Has patient been referred to the Quitline?: N/A patient is not a smoker  Patient has been referred for addiction treatment: Yes  Leisa Lenz, LCSW 01/05/2020, 11:20 AM

## 2020-01-05 NOTE — [facility] Suicide Risk Assessment (Signed)
[facility] Discharge Suicide Risk Assessment   Principal Problem: <principal problem not specified> Discharge Diagnoses: Active Problems:   Acute psychosis (HCC)   Total Time spent with patient: 15 minutes  Musculoskeletal: Strength & Muscle Tone: within normal limits Gait & Station: normal Patient leans: N/A  Psychiatric Specialty Exam: Review of Systems  All other systems reviewed and are negative.   Blood pressure 115/66, pulse 82, temperature 97.6 F (36.4 C), temperature source Oral, resp. rate 18, height 5' 8.75" (1.746 m), weight 67.6 kg, SpO2 98 %.Body mass index is 22.16 kg/m.  General Appearance: Casual  Eye Contact::  Good  Speech:  Normal Rate409  Volume:  Normal  Mood:  Anxious  Affect:  Congruent  Thought Process:  Coherent and Descriptions of Associations: Intact  Orientation:  Full (Time, Place, and Person)  Thought Content:  Delusions  Suicidal Thoughts:  No  Homicidal Thoughts:  No  Memory:  Immediate;   Fair Recent;   Fair Remote;   Fair  Judgement:  Intact  Insight:  Lacking  Psychomotor Activity:  Increased  Concentration:  Fair  Recall:  [employer] of Knowledge:Good  Language: Good  Akathisia:  Negative  Handed:  Right  AIMS (if indicated):     Assets:  Desire for Improvement Resilience  Sleep:  Number of Hours: 6.5  Cognition: WNL  ADL's:  Intact   Mental Status Per Nursing Assessment::   On Admission:  NA  Demographic Factors:  Male, Caucasian and Unemployed  Loss Factors: NA  Historical Factors: Impulsivity  Risk Reduction Factors:   Sense of responsibility to family, Living with another person, especially a relative and Positive social support  Continued Clinical Symptoms:  Depression:   Delusional Impulsivity Alcohol/Substance Abuse/Dependencies  Cognitive Features That Contribute To Risk:  None    Suicide Risk:  Minimal: No identifiable suicidal ideation.  Patients presenting with no risk factors but with morbid  ruminations; may be classified as minimal risk based on the severity of the depressive symptoms    Plan Of Care/Follow-up recommendations:  Activity:  ad lib  Antonieta Pert, MD 01/05/2020, 9:37 AM
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
## 2020-01-05 NOTE — Progress Notes (Signed)
Pt discharged to lobby. Pt was stable and appreciative at that time. All papers and prescriptions were given and valuables returned. Verbal understanding expressed. Denies SI/HI and A/VH. Pt given opportunity to express concerns and ask questions.  

## 2020-01-07 ENCOUNTER — Encounter (HOSPITAL_COMMUNITY): Payer: Self-pay

## 2020-01-08 DIAGNOSIS — R58 Hemorrhage, not elsewhere classified: Secondary | ICD-10-CM | POA: Diagnosis not present

## 2020-01-08 DIAGNOSIS — F29 Unspecified psychosis not due to a substance or known physiological condition: Secondary | ICD-10-CM | POA: Diagnosis not present

## 2020-01-08 DIAGNOSIS — F22 Delusional disorders: Secondary | ICD-10-CM | POA: Diagnosis not present

## 2020-01-08 DIAGNOSIS — F1210 Cannabis abuse, uncomplicated: Secondary | ICD-10-CM | POA: Diagnosis not present

## 2020-01-09 DIAGNOSIS — F411 Generalized anxiety disorder: Secondary | ICD-10-CM | POA: Diagnosis not present

## 2020-01-09 DIAGNOSIS — F451 Undifferentiated somatoform disorder: Secondary | ICD-10-CM | POA: Diagnosis not present

## 2020-01-14 DIAGNOSIS — F4323 Adjustment disorder with mixed anxiety and depressed mood: Secondary | ICD-10-CM | POA: Diagnosis not present

## 2020-01-19 DIAGNOSIS — F4322 Adjustment disorder with anxiety: Secondary | ICD-10-CM | POA: Diagnosis not present

## 2020-02-03 ENCOUNTER — Encounter (INDEPENDENT_AMBULATORY_CARE_PROVIDER_SITE_OTHER): Payer: Self-pay | Admitting: Otolaryngology

## 2020-02-03 ENCOUNTER — Ambulatory Visit (INDEPENDENT_AMBULATORY_CARE_PROVIDER_SITE_OTHER): Payer: BC Managed Care – PPO | Admitting: Otolaryngology

## 2020-02-03 ENCOUNTER — Other Ambulatory Visit: Payer: Self-pay

## 2020-02-03 VITALS — Temp 97.2°F

## 2020-02-03 DIAGNOSIS — H6123 Impacted cerumen, bilateral: Secondary | ICD-10-CM | POA: Diagnosis not present

## 2020-02-03 NOTE — Progress Notes (Signed)
HPI: Chase Mcneil is a 24 y.o. male who returns today for evaluation of a lump he feels underneath his chin as well as to have his ears checked and cleaned.  He has had some intermittent pain and discomfort in his ears.  He read that a tumor in the throat can cause ear pain.  He denies any trouble swallowing and no sore throat.  He is having no hearing problems and no drainage from the ears..  Past Medical History:  Diagnosis Date  . GERD (gastroesophageal reflux disease)   . Rosacea keratitis    Past Surgical History:  Procedure Laterality Date  . ESOPHAGOGASTRODUODENOSCOPY N/A 12/29/2019   Procedure: ESOPHAGOGASTRODUODENOSCOPY (EGD);  Surgeon: Diamantina Monks, MD;  Location: [facility] ENDOSCOPY;  Service: General;  Laterality: N/A;  . TONSILLECTOMY AND ADENOIDECTOMY     Social History   Socioeconomic History  . Marital status: Single    Spouse name: Not on file  . Number of children: Not on file  . Years of education: Not on file  . Highest education level: Not on file  Occupational History  . Not on file  Tobacco Use  . Smoking status: Former Smoker    Types: Cigarettes  . Smokeless tobacco: Never Used  . Tobacco comment: smokes a cig. every once in a while,   Vaping Use  . Vaping Use: Never used  Substance and Sexual Activity  . Alcohol use: Yes    Comment: occ  . Drug use: Yes    Types: Marijuana    Comment: "in last month"  . Sexual activity: Not Currently  Other Topics Concern  . Not on file  Social History Narrative   ** Merged History Encounter **       Social Determinants of Health   Financial Resource Strain:   . Difficulty of Paying Living Expenses:   Food Insecurity:   . Worried About Programme researcher, broadcasting/film/video in the Last Year:   . Barista in the Last Year:   Transportation Needs:   . Freight forwarder (Medical):   Marland Kitchen Lack of Transportation (Non-Medical):   Physical Activity:   . Days of Exercise per Week:   . Minutes of Exercise per Session:    Stress:   . Feeling of Stress :   Social Connections:   . Frequency of Communication with Friends and Family:   . Frequency of Social Gatherings with Friends and Family:   . Attends Religious Services:   . Active Member of Clubs or Organizations:   . Attends Banker Meetings:   Marland Kitchen Marital Status:    Family History  Problem Relation Age of Onset  . Liver cancer Father   . Stomach cancer Neg Hx   . Colon cancer Neg Hx    No Known Allergies Prior to Admission medications   Medication Sig Start Date End Date Taking? Authorizing Provider  albuterol (VENTOLIN HFA) 108 (90 Base) MCG/ACT inhaler Inhale 2 puffs into the lungs See admin instructions. Inhale 2 puffs into the lungs every 4-6 hours as needed for shortness of breath or wheezing 11/02/19  Yes [provider]  ALPRAZolam Prudy Feeler) 1 MG tablet Take 0.5-1 mg by mouth 2 (two) times daily as needed for anxiety.  11/15/19  Yes [provider]  ascorbic acid (VITAMIN C) 500 MG tablet Take 500-1,000 mg by mouth daily.   Yes [provider]  aspirin EC 325 MG tablet Take 325 mg by mouth every other day.  Yes [provider]  calcium carbonate (TUMS - DOSED IN MG ELEMENTAL CALCIUM) 500 MG chewable tablet Chew 1-2 tablets by mouth as needed for indigestion or heartburn.    Yes [provider]  COENZYME Q10 PO Take 30 mg by mouth 3 (three) times daily.   Yes [provider]  mirtazapine (REMERON) 30 MG tablet Take 1 tablet (30 mg total) by mouth at bedtime. 01/05/20  Yes Aldean Baker, NP  multivitamin (ONE-A-DAY MEN'S) TABS tablet Take 1 tablet by mouth daily.   Yes [provider]  NON FORMULARY Take 2 capsules by mouth See admin instructions. Sea [place] capsules; Take 2 capsules by mouth three times a week   Yes [provider]  OLANZapine zydis (ZYPREXA) 20 MG disintegrating tablet Take 1 tablet (20 mg total) by mouth at bedtime. 01/05/20  Yes Aldean Baker, NP   omeprazole (PRILOSEC) 40 MG capsule Take 40 mg by mouth daily as needed (for reflux).  10/11/19  Yes [provider]  valACYclovir (VALTREX) 500 MG tablet Take 500 mg by mouth daily. 10/07/19  Yes [provider]     Positive ROS: Otherwise negative  All other systems have been reviewed and were otherwise negative with the exception of those mentioned in the HPI and as above.  Physical Exam: Constitutional: Alert, well-appearing, no acute distress Ears: External ears without lesions or tenderness.  He has slightly narrow ear canals bilaterally.  He has wax buildup in the lateral portion of the ear canals around the hairs with several hairs down in the ear canal.  The wax and hairs were removed with suction as well as forceps and curettes.  Both TMs were clear with good mobility on pneumatic otoscopy. Nasal: External nose without lesions. Clear nasal passages Oral: Lips and gums without lesions. Tongue and palate mucosa without lesions. Posterior oropharynx clear.  Patient is status post tonsillectomy with clear oropharynx.  Indirect laryngoscopy revealed a clear base of tongue.  Floor mouth is clear and drainage from the semitubular duct was clear bilaterally with no palpable stone or palpable mass in the floor mouth. Neck: No palpable adenopathy or masses.  The submental area does have a small less than 1 cm lymph node but this is nontender and not significantly enlarged. Respiratory: Breathing comfortably  Skin: No facial/neck lesions or rash noted.  Cerumen impaction removal  Date/Time: 02/03/2020 2:08 PM Performed by: Drema Halon, MD Authorized by: Drema Halon, MD   Consent:    Consent obtained:  Verbal   Consent given by:  Patient   Risks discussed:  Pain and bleeding Procedure details:    Location:  L ear and R ear   Procedure type: curette, suction and forceps   Post-procedure details:    Inspection:  TM intact and canal normal   Hearing  quality:  Improved   Patient tolerance of procedure:  Tolerated well, no immediate complications Comments:     TMs are clear bilaterally.    Assessment: Minimal or moderate wax buildup bilaterally.  Clear canals otherwise.  Plan: He will follow-up if the lymph node enlarges in the submental area. Otherwise will follow-up as needed.   Narda Bonds, MD

## 2020-07-07 ENCOUNTER — Telehealth: Payer: Self-pay | Admitting: Psychiatry

## 2020-07-07 NOTE — Telephone Encounter (Signed)
Patient was recently hospitalized and treated by and  referred to Dr. Evelene Croon.  Do not reschedule this patient here. Father needs to contact Dr. Carie Caddy office.  We are no longer his provider and will not accept him back.

## 2020-07-07 NOTE — Telephone Encounter (Signed)
Jamaree's father called about needing advise for possible hospitalization. Can you please call to advise. [phone]

## 2020-07-08 NOTE — Telephone Encounter (Signed)
I called Molli Hazard back and gave him this info.

## 2020-08-02 ENCOUNTER — Telehealth: Payer: Self-pay | Admitting: Psychiatry

## 2020-08-02 NOTE — Telephone Encounter (Signed)
See previous phone message on Chase Mcneil from 07/08/20. Hoang's dad called and said that he hasn't seen Dr. Evelene Croon in about 9 months and hasn't seen Dr. Sherrie Sport in 6 months. His dad said that Nur is no longer taking medication and is in a deep depression. His dad is worred about him. Since he is no longer seeing Dr. Evelene Croon can we see him? [phone].

## 2020-08-02 NOTE — Telephone Encounter (Signed)
Patient was hospitalized 12/2019 and treated by and  referred to Dr. Evelene Croon.  Do not reschedule this patient here. Father needs to contact Dr. Carie Caddy office.  We are no longer his provider and will not accept him back.

## 2020-11-16 IMAGING — DX DG ABDOMEN 1V
2 series · 2 of 2 positions shown · non-contrast
Comparison: None.

CLINICAL DATA: Question foreign body.  Rectal bleeding.

EXAM:
ABDOMEN - 1 VIEW

[abdomen kub (1 of 2)]
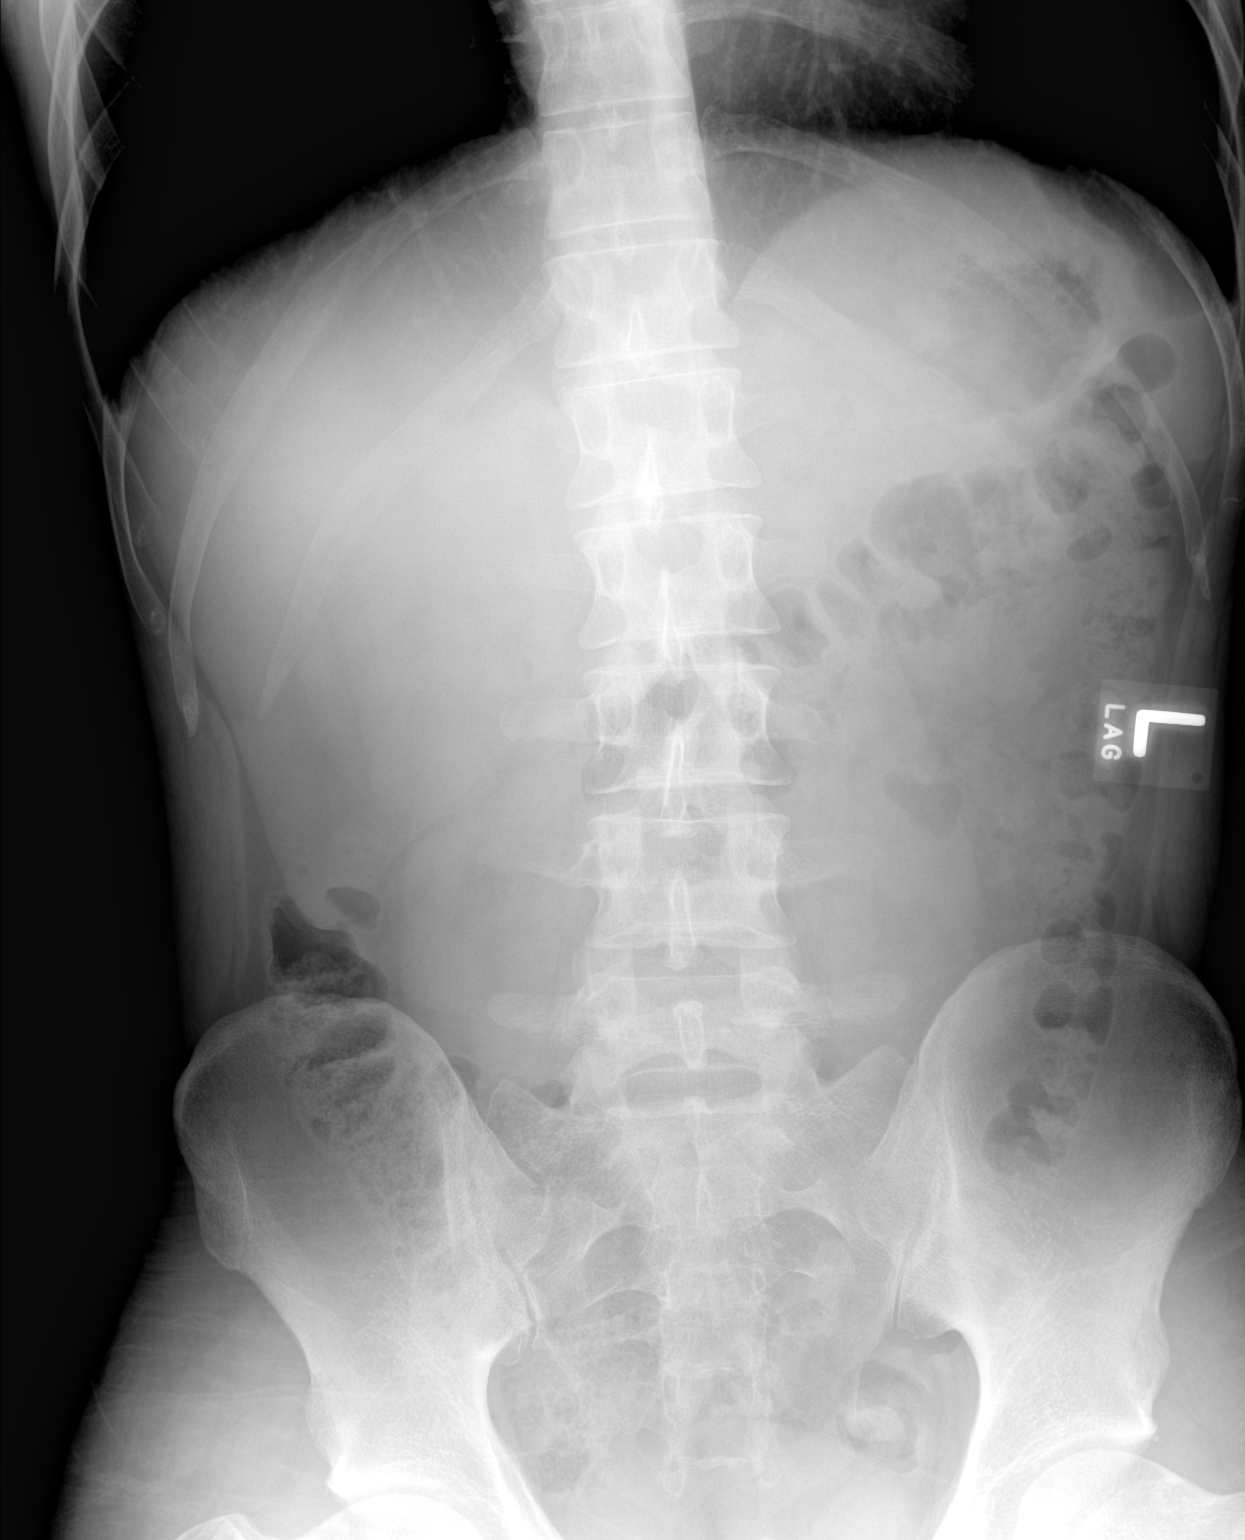

[abdomen kub (2 of 2)]
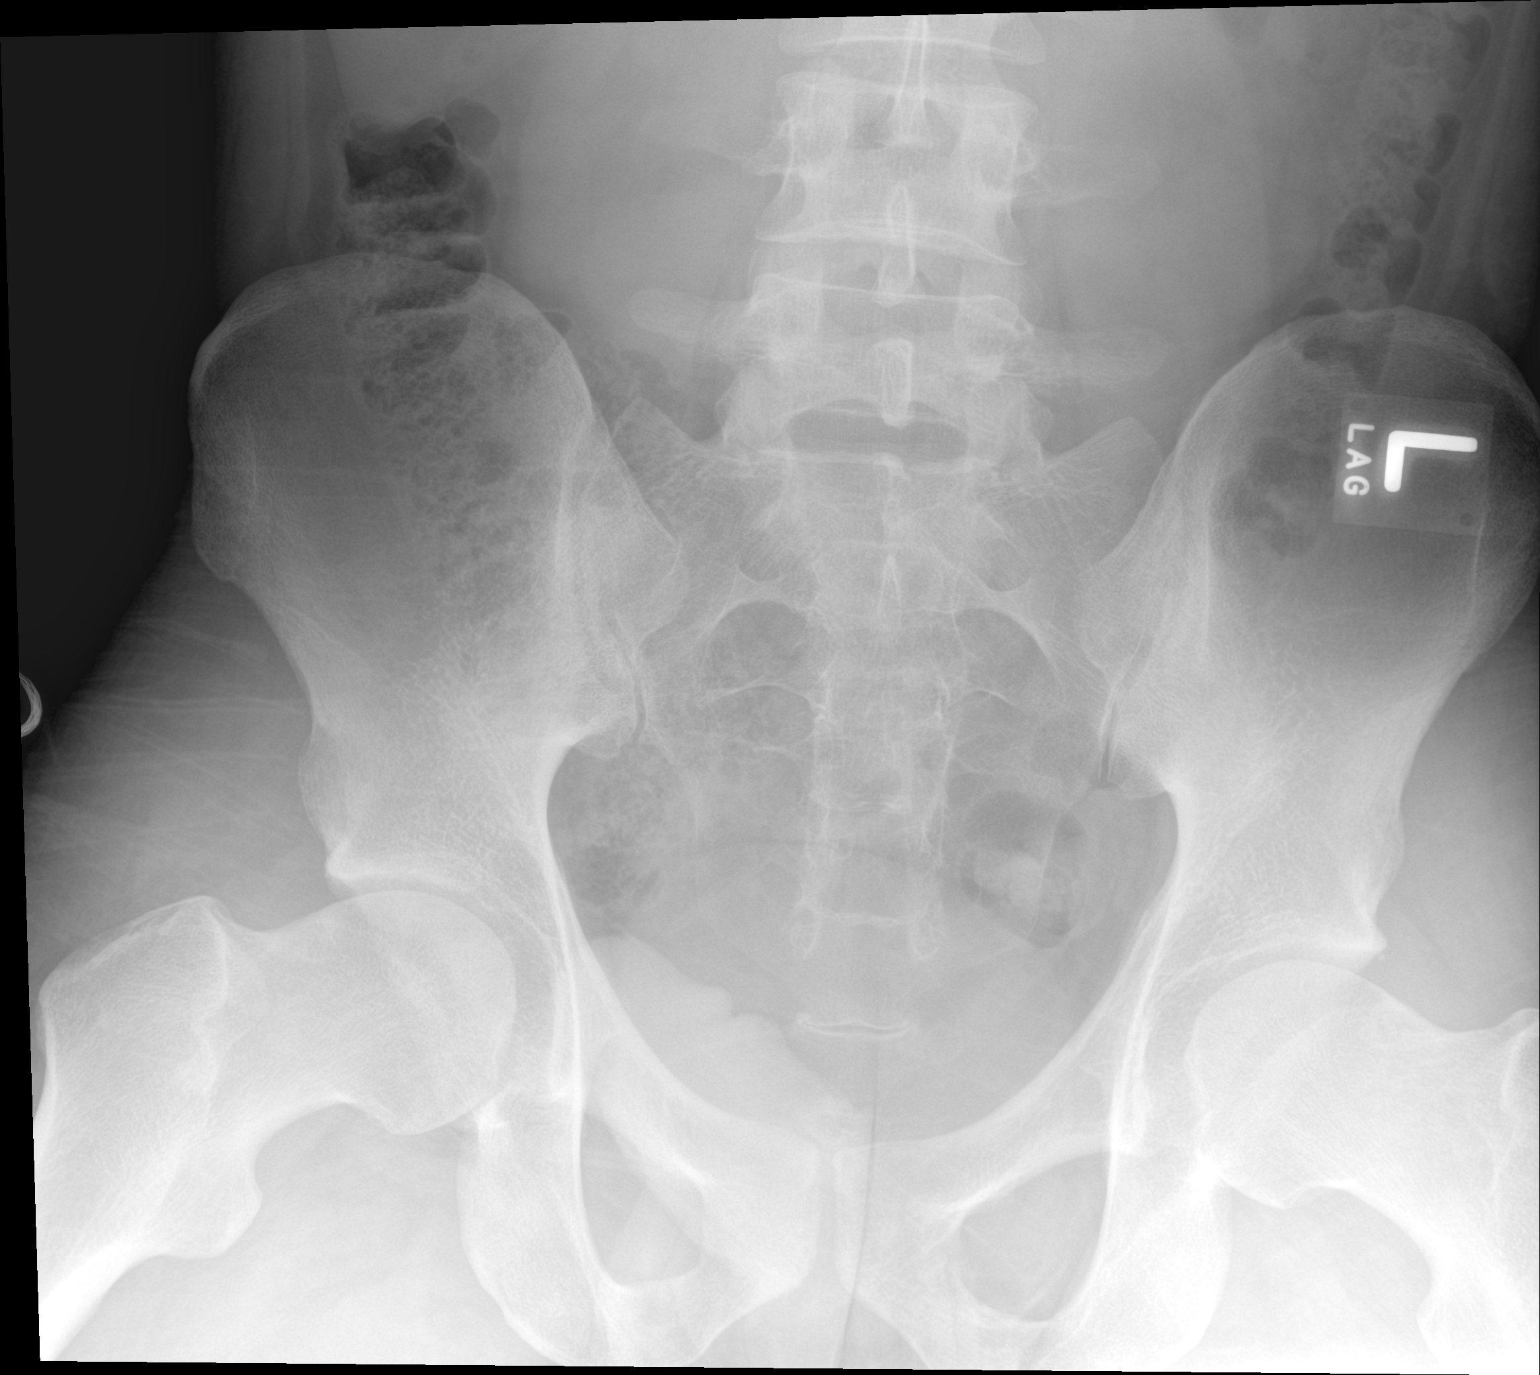

[2 of 2 positions shown; findings below may reference images not displayed]

FINDINGS: The bowel gas pattern is normal. No radio-opaque calculi or other
significant radiographic abnormality are seen. No radiopaque foreign
bodies.
IMPRESSION: Negative.

## 2021-03-30 ENCOUNTER — Telehealth: Payer: Self-pay | Admitting: Psychiatry

## 2021-03-30 NOTE — Telephone Encounter (Signed)
Abby from Dr. Carie Caddy office called to say that Chase Mcneil is not a patient there any longer and has not been seen. IF we would like his records transferred, we can send a release for them. [name]'s Father may call to make him an appt with Dr. Jennelle Human. What do you want [place] to do?

## 2021-04-05 ENCOUNTER — Telehealth: Payer: Self-pay | Admitting: Psychiatry

## 2021-04-05 NOTE — Telephone Encounter (Signed)
Father, Chase Mcneil, called with Chase Mcneil on the line for permission. Chase Mcneil gave identifying info and verified we can speack with his father. Chase Mcneil is asking if Chase Mcneil can see you. He has been in [place] with his [place] mother for the last 7 months getting treatment by psychiatrist/neurologist. He is currently on medication- Zyprexa 20mg  and akinetone 4mg .[name] is agreeable to continue it and has about 14 days left. He is back in and asking for appointment with you. Dr Chase Mcneil released him (in previous note) and Dr. [place] initially referred him to you in 2021 for first visit. Is it ok to schedule?

## 2021-04-05 NOTE — Telephone Encounter (Signed)
Tell them I'm not accepting pts bc my schedule is too full.  It's an option he see Judie Grieve but I'm not sure you want to give that option since [place] fired him.  Sounds like a problem

## 2021-04-07 NOTE — Telephone Encounter (Signed)
Note was sent to [facility] and Avelina Laine- Avelina Laine agreed to see him. I told the patient's father and he is not happy but may have Joselyn Glassman see Arlys John. I explained that Dr. Alwyn Ren schedule is too full and he cannot accomodate [employer].

## 2021-04-08 NOTE — Telephone Encounter (Signed)
He is scheduled to see Avelina Laine, NP on Oct 25. As he may run out of meds, I gave him a couple of options to pursue refills. Oct 25th was the first avail with Arlys John being the only agreeing NP to see him.

## 2021-04-26 ENCOUNTER — Ambulatory Visit: Payer: BC Managed Care – PPO | Admitting: Behavioral Health

## 2021-05-02 ENCOUNTER — Other Ambulatory Visit: Payer: Self-pay

## 2021-05-02 ENCOUNTER — Other Ambulatory Visit: Payer: Self-pay | Admitting: Behavioral Health

## 2021-05-02 ENCOUNTER — Encounter: Payer: Self-pay | Admitting: Behavioral Health

## 2021-05-02 ENCOUNTER — Ambulatory Visit (INDEPENDENT_AMBULATORY_CARE_PROVIDER_SITE_OTHER): Payer: BC Managed Care – PPO | Admitting: Behavioral Health

## 2021-05-02 DIAGNOSIS — F23 Brief psychotic disorder: Secondary | ICD-10-CM

## 2021-05-02 DIAGNOSIS — F411 Generalized anxiety disorder: Secondary | ICD-10-CM

## 2021-05-02 DIAGNOSIS — F22 Delusional disorders: Secondary | ICD-10-CM

## 2021-05-02 DIAGNOSIS — F319 Bipolar disorder, unspecified: Secondary | ICD-10-CM | POA: Diagnosis not present

## 2021-05-02 DIAGNOSIS — F3189 Other bipolar disorder: Secondary | ICD-10-CM

## 2021-05-02 MED ORDER — LYBALVI 20-10 MG PO TABS: 20.0000 mg | ORAL_TABLET | Freq: Every day | ORAL | 1 refills | Status: DC

## 2021-05-02 NOTE — Telephone Encounter (Signed)
If alternative is requested does that mean they may need a PA?
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
## 2021-05-04 ENCOUNTER — Telehealth: Payer: Self-pay | Admitting: Behavioral Health

## 2021-05-04 NOTE — Telephone Encounter (Signed)
Pt's mom Laurine Blazer called reporting Pt is too sleepy with the new meds. Please advise. Contact # given is Pt's # [phone]. No DPR for mom. Next apt 11/29.

## 2021-05-04 NOTE — Telephone Encounter (Signed)
Yes its a PA

## 2021-05-04 NOTE — Telephone Encounter (Signed)
He started lybalvi 2 days ago and it's making him sleepy during the day.Is this a short term effect or something they should be concerned about?They are already concerned that he is too tired during the day and sleeping.

## 2021-05-04 NOTE — Telephone Encounter (Signed)
Pt informed

## 2021-05-04 NOTE — Telephone Encounter (Signed)
He should be taking medication at bedtime. But this is very common in the beginning. This is nothing to be concerned about and this will improve over the next couple of weeks. Most all of the medication in this class can do this until he gets used to the med. Please inform. Thanks.

## 2021-05-06 NOTE — Telephone Encounter (Signed)
Yes, please advise him that we can provide more samples for now. He can come by the office and pick up when he gets low. Tell him that we are working on the [place]. Thanks

## 2021-05-06 NOTE — Telephone Encounter (Signed)
I discussed the PA with Arlys John and I'm waiting for him to review

## 2021-05-12 NOTE — Telephone Encounter (Signed)
LM to have him call back to discuss how he's doing on medication.

## 2021-05-13 NOTE — Telephone Encounter (Signed)
Awesome news. I will list dx today. I feel a little more comfortable after response to meds.

## 2021-05-13 NOTE — Telephone Encounter (Signed)
Pt's parents called back, they had me on speaker and report pt is improving so much on Lybalvi, he's smiling and went out to dinner with their family. Also with the co-pay card pt just picked up a 30 day supply free then will receive 2 more months. Mom wants to thank Chase Mcneil so much for helping Chase Mcneil.    I did not submit a PA due to his diagnosis yet.   Also Dad reports Chase Mcneil signed a form that his parents can't call about his condition. They want to try and get that changed maybe when he comes back on 11/29. I did not disclose any personal information about pt, they were reporting his progress only.

## 2021-05-20 ENCOUNTER — Telehealth: Payer: Self-pay

## 2021-05-20 NOTE — Telephone Encounter (Signed)
Prior Authorization submitted for LYBALVI 20-10 MG with [facility], unable to receive a determination because medication is excluded from pt's benefits.    I will contact [facility] [phone] to follow up more.

## 2021-05-20 NOTE — Telephone Encounter (Signed)
Medication is not available under pt's plan. Unsure about next year. His Dad did mention they had a couple months of getting refills with the co-pay card and no PA on file.

## 2021-05-24 NOTE — Telephone Encounter (Signed)
Please make sure pt knows we can provide samples until we can find solution. Thank you

## 2021-05-24 NOTE — Telephone Encounter (Signed)
Noted thanks °

## 2021-05-31 ENCOUNTER — Other Ambulatory Visit: Payer: Self-pay

## 2021-05-31 ENCOUNTER — Encounter: Payer: Self-pay | Admitting: Behavioral Health

## 2021-05-31 ENCOUNTER — Ambulatory Visit (INDEPENDENT_AMBULATORY_CARE_PROVIDER_SITE_OTHER): Payer: BC Managed Care – PPO | Admitting: Behavioral Health

## 2021-05-31 VITALS — Wt 184.0 lb

## 2021-05-31 DIAGNOSIS — F3189 Other bipolar disorder: Secondary | ICD-10-CM | POA: Diagnosis not present

## 2021-05-31 DIAGNOSIS — F22 Delusional disorders: Secondary | ICD-10-CM

## 2021-05-31 DIAGNOSIS — F411 Generalized anxiety disorder: Secondary | ICD-10-CM | POA: Diagnosis not present

## 2021-05-31 DIAGNOSIS — F319 Bipolar disorder, unspecified: Secondary | ICD-10-CM

## 2021-05-31 NOTE — Progress Notes (Signed)
[name]  Patient ID: Chase Mcneil,  MRN: [id number]  PCP: Creola Corn, MD  Date of Evaluation: 05/31/2021 Time spent:30 minutes  Chief Complaint:  Chief Complaint   Anxiety; Depression; Manic Behavior; Medication Refill; Follow-up; Medication Problem; Family Problem     HISTORY/CURRENT STATUS: HPI  25 year old male presents to this office for follow up. His mother is present with his consent. He says that he like Lybalvi and has been feeling much better on the medication. He says his anxiety is 3/10 and depression is 2/10 at this time. He is sleeping 7-8 hours per night. He no longer needs the Mirtazapine for sleep. Says his parents are concerned that he has lack of motivation to work but he feels that he is still mending. He wants to start working out again with Cardio exercises. He says his weights are holding steady at 184 lbs but he has not gained anymore weight. He has noticed difference that his appetite is not as great as before on Zyprexa. He denies any more psychosis, auditory or visual hallucinations. He denies SI/HI.    Previous psychiatric medication trials: Abilify- says that he had experienced increased odd behavior according to mother and they stopped administration. Occurred in [place]? Olanzapine-Excessive weight gain. Pt does not want to continue   Individual Medical History/ Review of Systems: Changes? :No   Allergies: Patient has no known allergies.  Current Medications:  Current Outpatient Medications:    albuterol (VENTOLIN HFA) 108 (90 Base) MCG/ACT inhaler, Inhale 2 puffs into the lungs See admin instructions. Inhale 2 puffs into the lungs every 4-6 hours as needed for shortness of breath or wheezing (Patient not taking: Reported on 05/02/2021), Disp: , Rfl:    ALPRAZolam (XANAX) 1 MG tablet, Take 0.5-1 mg by mouth 2 (two) times daily as needed for anxiety. , Disp: , Rfl:    ascorbic acid (VITAMIN C) 500 MG tablet, Take 500-1,000 mg by mouth  daily. (Patient not taking: Reported on 05/02/2021), Disp: , Rfl:    aspirin EC 325 MG tablet, Take 325 mg by mouth every other day. (Patient not taking: Reported on 05/02/2021), Disp: , Rfl:    calcium carbonate (TUMS - DOSED IN MG ELEMENTAL CALCIUM) 500 MG chewable tablet, Chew 1-2 tablets by mouth as needed for indigestion or heartburn.  (Patient not taking: Reported on 05/02/2021), Disp: , Rfl:    COENZYME Q10 PO, Take 30 mg by mouth 3 (three) times daily. (Patient not taking: Reported on 05/02/2021), Disp: , Rfl:    LYBALVI 20-10 MG TABS, TAKE 20 MG BY MOUTH DAILY., Disp: 30 tablet, Rfl: 1   mirtazapine (REMERON) 30 MG tablet, Take 1 tablet (30 mg total) by mouth at bedtime. (Patient not taking: Reported on 05/02/2021), Disp: 30 tablet, Rfl: 0   multivitamin (ONE-A-DAY MEN'S) TABS tablet, Take 1 tablet by mouth daily. (Patient not taking: Reported on 05/02/2021), Disp: , Rfl:    NON FORMULARY, Take 2 capsules by mouth See admin instructions. Sea Moss capsules; Take 2 capsules by mouth three times a week (Patient not taking: Reported on 05/02/2021), Disp: , Rfl:    OLANZapine zydis (ZYPREXA) 20 MG disintegrating tablet, Take 1 tablet (20 mg total) by mouth at bedtime., Disp: 30 tablet, Rfl: 0   omeprazole (PRILOSEC) 40 MG capsule, Take 40 mg by mouth daily as needed (for reflux).  (Patient not taking: Reported on 05/02/2021), Disp: , Rfl:    valACYclovir (VALTREX) 500 MG tablet, Take 500 mg by mouth daily., Disp: ,  Rfl:   Current Facility-Administered Medications:    0.9 %  sodium chloride infusion, 500 mL, Intravenous, Continuous, Irene Shipper, MD Medication Side Effects: none  Family Medical/ Social History: Changes? No  MENTAL HEALTH EXAM:  There were no vitals taken for this visit.There is no height or weight on file to calculate BMI.  General Appearance: Casual, Neat, and Well Groomed  Eye Contact:  Good  Speech:  Clear and Coherent  Volume:  Normal  Mood:  Anxious  Affect:  Flat  and Restricted  Thought Process:  Coherent  Orientation:  Full (Time, Place, and Person)  Thought Content: Logical   Suicidal Thoughts:  No  Homicidal Thoughts:  No  Memory:  WNL  Judgement:  Good  Insight:  Good  Psychomotor Activity:  Normal  Concentration:  Concentration: Fair  Recall:  [employer] of Knowledge: Fair  Language: Fair  Assets:  Desire for Improvement  ADL's:  Intact  Cognition: WNL  Prognosis:  Good    DIAGNOSES:    ICD-10-CM   1. Delusions ([place])  F22     2. Generalized anxiety disorder  F41.1     3. Paranoia ([place])  F22     4. Bipolar I disorder ([place])  F31.9     5. 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 Discussed his significant improvement with Lybalvi. His weight has been stable and holding at 184 lbs. He is happy with medication and does not want to change. We are still working with insurance approval due to drug not being on plan. Provide 1 mo. Samples of Lybalvi 20/10.     Agreed to:  Continue Lybalvi 20/10 mg  daily at bedtime Pt Stopped Mirtazapine. Will report side effects or worsening symptoms promptly To follow up in 4 weeks to reassess Order labs next visit Provided emergency contact information Reviewed [place], NP

## 2021-07-07 ENCOUNTER — Other Ambulatory Visit: Payer: Self-pay | Admitting: Behavioral Health

## 2021-07-07 DIAGNOSIS — F23 Brief psychotic disorder: Secondary | ICD-10-CM

## 2021-07-07 DIAGNOSIS — F411 Generalized anxiety disorder: Secondary | ICD-10-CM

## 2021-07-07 DIAGNOSIS — F22 Delusional disorders: Secondary | ICD-10-CM

## 2021-07-12 ENCOUNTER — Other Ambulatory Visit: Payer: Self-pay | Admitting: Behavioral Health

## 2021-07-12 ENCOUNTER — Encounter: Payer: Self-pay | Admitting: Behavioral Health

## 2021-07-12 ENCOUNTER — Ambulatory Visit (INDEPENDENT_AMBULATORY_CARE_PROVIDER_SITE_OTHER): Payer: BC Managed Care – PPO | Admitting: Behavioral Health

## 2021-07-12 ENCOUNTER — Other Ambulatory Visit: Payer: Self-pay

## 2021-07-12 DIAGNOSIS — F319 Bipolar disorder, unspecified: Secondary | ICD-10-CM

## 2021-07-12 DIAGNOSIS — F411 Generalized anxiety disorder: Secondary | ICD-10-CM | POA: Diagnosis not present

## 2021-07-12 DIAGNOSIS — F22 Delusional disorders: Secondary | ICD-10-CM

## 2021-07-12 MED ORDER — LYBALVI 15-10 MG PO TABS: 15.0000 mg | ORAL_TABLET | Freq: Every day | ORAL | 1 refills | Status: DC

## 2021-07-12 NOTE — Progress Notes (Signed)
[name]  Patient ID: Chase Mcneil,  MRN: [id number]  PCP: Shon Baton, MD  Date of Evaluation: 07/12/2021 Time spent:20 minutes  Chief Complaint:   HISTORY/CURRENT STATUS: HPI 26 year old male presents to this office for follow up. His mother is present with his consent. He says that he like Lybalvi and has been feeling much better on the medication. Both his parents agree that he is better mentally. He says his anxiety is 3/10 and depression is 2/10 at this time. However his concern is he is sleeping 12 hours per day. He is going to bed around 1-2 am and sleeping into the afternoon. Says his parents are concerned that he has lack of motivation to work but he feels that he is still mending. He wants to start working out again with Cardio exercises. He says his weights are holding steady at 184 lbs but he has not gained anymore weight. His parents say he is not eating that healthy. He would like to try slightly reducing the Lybalvi to see if this helps.  He denies any more psychosis, auditory or visual hallucinations. He denies SI/HI.     Previous psychiatric medication trials: Abilify- says that he had experienced increased odd behavior according to mother and they stopped administration. Occurred in [place]? Olanzapine-Excessive weight gain. Pt does not want to continue        Individual Medical History/ Review of Systems: Changes? :No   Allergies: Patient has no known allergies.  Current Medications:  Current Outpatient Medications:    LYBALVI 20-10 MG TABS, TAKE 20 MG BY MOUTH DAILY., Disp: 30 tablet, Rfl: 1   OLANZapine-Samidorphan (LYBALVI) 15-10 MG TABS, Take 15 mg by mouth daily., Disp: 30 tablet, Rfl: 1   albuterol (VENTOLIN HFA) 108 (90 Base) MCG/ACT inhaler, Inhale 2 puffs into the lungs See admin instructions. Inhale 2 puffs into the lungs every 4-6 hours as needed for shortness of breath or wheezing (Patient not taking: Reported on 05/02/2021), Disp: ,  Rfl:    ALPRAZolam (XANAX) 1 MG tablet, Take 0.5-1 mg by mouth 2 (two) times daily as needed for anxiety.  (Patient not taking: Reported on 07/12/2021), Disp: , Rfl:    ascorbic acid (VITAMIN C) 500 MG tablet, Take 500-1,000 mg by mouth daily. (Patient not taking: Reported on 05/02/2021), Disp: , Rfl:    aspirin EC 325 MG tablet, Take 325 mg by mouth every other day. (Patient not taking: Reported on 05/02/2021), Disp: , Rfl:    calcium carbonate (TUMS - DOSED IN MG ELEMENTAL CALCIUM) 500 MG chewable tablet, Chew 1-2 tablets by mouth as needed for indigestion or heartburn.  (Patient not taking: Reported on 05/02/2021), Disp: , Rfl:    COENZYME Q10 PO, Take 30 mg by mouth 3 (three) times daily. (Patient not taking: Reported on 05/02/2021), Disp: , Rfl:    mirtazapine (REMERON) 30 MG tablet, Take 1 tablet (30 mg total) by mouth at bedtime. (Patient not taking: Reported on 05/02/2021), Disp: 30 tablet, Rfl: 0   multivitamin (ONE-A-DAY MEN'S) TABS tablet, Take 1 tablet by mouth daily. (Patient not taking: Reported on 05/02/2021), Disp: , Rfl:    NON FORMULARY, Take 2 capsules by mouth See admin instructions. Sea [place] capsules; Take 2 capsules by mouth three times a week (Patient not taking: Reported on 05/02/2021), Disp: , Rfl:    OLANZapine zydis (ZYPREXA) 20 MG disintegrating tablet, Take 1 tablet (20 mg total) by mouth at bedtime. (Patient not taking: Reported on 07/12/2021), Disp: 30 tablet,  Rfl: 0   omeprazole (PRILOSEC) 40 MG capsule, Take 40 mg by mouth daily as needed (for reflux).  (Patient not taking: Reported on 05/02/2021), Disp: , Rfl:    valACYclovir (VALTREX) 500 MG tablet, Take 500 mg by mouth daily. (Patient not taking: Reported on 07/12/2021), Disp: , Rfl:   Current Facility-Administered Medications:    0.9 %  sodium chloride infusion, 500 mL, Intravenous, Continuous, Irene Shipper, MD Medication Side Effects: none  Family Medical/ Social History: Changes? No  MENTAL HEALTH  EXAM:  There were no vitals taken for this visit.There is no height or weight on file to calculate BMI.  General Appearance: Casual and Neat  Eye Contact:  Good  Speech:  Clear and Coherent  Volume:  Normal  Mood:  NA  Affect:  Appropriate  Thought Process:  Coherent  Orientation:  Full (Time, Place, and Person)  Thought Content: WDL   Suicidal Thoughts:  No  Homicidal Thoughts:  No  Memory:  WNL  Judgement:  Good  Insight:  Good  Psychomotor Activity:  Normal  Concentration:  Concentration: Good  Recall:  Good  Fund of Knowledge: Good  Language: Good  Assets:  Desire for Improvement  ADL's:  Intact  Cognition: WNL  Prognosis:  Good    DIAGNOSES:    ICD-10-CM   1. Bipolar I disorder ([facility])  F31.9 OLANZapine-Samidorphan (LYBALVI) 15-10 MG TABS    2. Delusions ([facility])  F22 OLANZapine-Samidorphan (LYBALVI) 15-10 MG TABS    3. Generalized anxiety disorder  F41.1 OLANZapine-Samidorphan (LYBALVI) 15-10 MG TABS      Receiving Psychotherapy: No    RECOMMENDATIONS:  Greater than 50% of 20  face to face time with patient was spent on counseling and coordination of care. Discussed his continued significant improvement with Lybalvi. His weight has been stable and holding at 184 lbs. He is happy with medication and does not want to change. His parents are very concerned with his lethargy and no motivation. He is sleeping excessively. Discussed backing down medication slowly, but he is instructed to practice good sleep hygiene. Going to bed no later than We are still working with insurance approval due to drug not being on plan. Provide 1 mo. Samples of Lybalvi 20/10.     Agreed to:   Continue Lybalvi 20/10 mg  daily at bedtime Pt Stopped Mirtazapine. Will report side effects or worsening symptoms promptly To follow up in 4 weeks to reassess Order labs next visit Provided emergency contact information Reviewed [place], NP

## 2021-07-12 NOTE — Telephone Encounter (Signed)
Medication is not covered under his plan. Two Rx for different dosages have been sent. Not sure what the next course of action is.

## 2021-07-13 NOTE — Telephone Encounter (Signed)
Noted it's been discussed with Arlys John. Thanks

## 2021-07-14 NOTE — Telephone Encounter (Signed)
Will contact [facility] about the medication. Not on his plan.

## 2021-08-26 ENCOUNTER — Telehealth: Payer: Self-pay | Admitting: Behavioral Health

## 2021-08-26 NOTE — Telephone Encounter (Signed)
Pt called requesting samples of Lybalvi 15 mg 1/d. Only has 6 pills left and unable to get meds from card given. Meds too expensive and will talk to Arlys John about changing at apt 3/10. Advise if I need to put samples. [phone]

## 2021-08-29 ENCOUNTER — Telehealth: Payer: Self-pay | Admitting: Behavioral Health

## 2021-08-29 NOTE — Telephone Encounter (Signed)
Chase Mcneil, I am going to get samples for him and check into a couple ideas I have.

## 2021-08-29 NOTE — Telephone Encounter (Signed)
Pt's mom called advising LYBALVI 15 mg is working well, then she reports he is sleeping too much. Very tired. Wakes up at 6 pm still tired. Has apt 3/10 and wants to see you before to decide if meds need to be adjusted. Mentioned med before, had a lot of weight gain. Contact @ [phone] ASAP. Has a few pills left.

## 2021-08-29 NOTE — Telephone Encounter (Signed)
Just FYI, Rtc to Mom, she discussed pt's medication that it helps his delusions, hallucinations, thoughts are much clearer but he's sleeping all the time. He is hard to wake up, doesn't usually get out of bed till evening time and back to sleep in about 6 hours. Not having a social life, not driving, not able to apply for jobs. His Psychologist reports he is doing well with his thoughts and he is working on the social side of things. Medication effective but too sedating. Informed Mom I had samples ready of his current dose to get him to his apt on 09/09/21 then everything can be discussed.

## 2021-08-29 NOTE — Telephone Encounter (Signed)
Thank you Traci.

## 2021-08-29 NOTE — Telephone Encounter (Signed)
Thank you for your help. I will reassess when he comes in but he should continue medication for now.

## 2021-09-09 ENCOUNTER — Other Ambulatory Visit: Payer: Self-pay

## 2021-09-09 ENCOUNTER — Ambulatory Visit (INDEPENDENT_AMBULATORY_CARE_PROVIDER_SITE_OTHER): Payer: BC Managed Care – PPO | Admitting: Behavioral Health

## 2021-09-09 DIAGNOSIS — F411 Generalized anxiety disorder: Secondary | ICD-10-CM

## 2021-09-09 DIAGNOSIS — F319 Bipolar disorder, unspecified: Secondary | ICD-10-CM

## 2021-09-11 NOTE — Progress Notes (Deleted)
[name] ? ?Patient ID: Chase Mcneil,  ?MRN: [id number] ? ?PCP: Creola Corn, MD ? ?Date of Evaluation: 09/11/2021 ?Time spent:30 minutes ? ?Chief Complaint:  ?Chief Complaint   ?Anxiety; Depression; Fatigue; Medication Refill; Medication Problem; Follow-up ?  ? ? ?HISTORY/CURRENT STATUS: ?HPI ? ?Individual Medical History/ Review of Systems: Changes? :No  ? ?Allergies: Patient has no known allergies. ? ?Current Medications:  ?Current Outpatient Medications:  ?  albuterol (VENTOLIN HFA) 108 (90 Base) MCG/ACT inhaler, Inhale 2 puffs into the lungs See admin instructions. Inhale 2 puffs into the lungs every 4-6 hours as needed for shortness of breath or wheezing (Patient not taking: Reported on 05/02/2021), Disp: , Rfl:  ?  ALPRAZolam (XANAX) 1 MG tablet, Take 0.5-1 mg by mouth 2 (two) times daily as needed for anxiety.  (Patient not taking: Reported on 07/12/2021), Disp: , Rfl:  ?  ascorbic acid (VITAMIN C) 500 MG tablet, Take 500-1,000 mg by mouth daily. (Patient not taking: Reported on 05/02/2021), Disp: , Rfl:  ?  aspirin EC 325 MG tablet, Take 325 mg by mouth every other day. (Patient not taking: Reported on 05/02/2021), Disp: , Rfl:  ?  calcium carbonate (TUMS - DOSED IN MG ELEMENTAL CALCIUM) 500 MG chewable tablet, Chew 1-2 tablets by mouth as needed for indigestion or heartburn.  (Patient not taking: Reported on 05/02/2021), Disp: , Rfl:  ?  COENZYME Q10 PO, Take 30 mg by mouth 3 (three) times daily. (Patient not taking: Reported on 05/02/2021), Disp: , Rfl:  ?  LYBALVI 20-10 MG TABS, TAKE 20 MG BY MOUTH DAILY., Disp: 30 tablet, Rfl: 1 ?  mirtazapine (REMERON) 30 MG tablet, Take 1 tablet (30 mg total) by mouth at bedtime. (Patient not taking: Reported on 05/02/2021), Disp: 30 tablet, Rfl: 0 ?  multivitamin (ONE-A-DAY MEN'S) TABS tablet, Take 1 tablet by mouth daily. (Patient not taking: Reported on 05/02/2021), Disp: , Rfl:  ?  NON FORMULARY, Take 2 capsules by mouth See admin instructions. [place] capsules; Take 2 capsules by mouth three times a week (Patient not taking: Reported on 05/02/2021), Disp: , Rfl:  ?  OLANZapine zydis (ZYPREXA) 20 MG disintegrating tablet, Take 1 tablet (20 mg total) by mouth at bedtime. (Patient not taking: Reported on 07/12/2021), Disp: 30 tablet, Rfl: 0 ?  OLANZapine-Samidorphan (LYBALVI) 15-10 MG TABS, Take 15 mg by mouth daily., Disp: 30 tablet, Rfl: 1 ?  omeprazole (PRILOSEC) 40 MG capsule, Take 40 mg by mouth daily as needed (for reflux).  (Patient not taking: Reported on 05/02/2021), Disp: , Rfl:  ?  valACYclovir (VALTREX) 500 MG tablet, Take 500 mg by mouth daily. (Patient not taking: Reported on 07/12/2021), Disp: , Rfl:  ? ?Current Facility-Administered Medications:  ?  0.9 %  sodium chloride infusion, 500 mL, Intravenous, Continuous, Hilarie Fredrickson, MD ?Medication Side Effects: none ? ?Family Medical/ Social History: Changes? No ? ?MENTAL HEALTH EXAM: ? ?There were no vitals taken for this visit.There is no height or weight on file to calculate BMI.  ?General Appearance: Casual, Neat, and Well Groomed  ?Eye Contact:  Good  ?Speech:  Clear and Coherent and Pressured  ?Volume:  Decreased  ?Mood:  Anxious, Depressed, and Dysphoric  ?Affect:  Appropriate, Congruent, Depressed, Flat, and Anxious  ?Thought Process:  Coherent  ?Orientation:  Full (Time, Place, and Person)  ?Thought Content: Logical   ?Suicidal Thoughts:  No  ?Homicidal Thoughts:  No  ?Memory:  WNL  ?Judgement:  Good  ?Insight:  Good  ?  Psychomotor Activity:  Normal  ?Concentration:  Concentration: Fair  ?Recall:  Fair  ?Fund of Knowledge: Fair  ?Language: Fair  ?Assets:  Desire for Improvement  ?ADL's:  Intact  ?Cognition: WNL  ?Prognosis:  Good  ? ? ?DIAGNOSES:  ?  ICD-10-CM   ?1. Bipolar I disorder (HCC)  F31.9   ?  ?2. Generalized anxiety disorder  F41.1   ?  ? ? ?Receiving Psychotherapy: No  ? ? ?RECOMMENDATIONS: *** ? ? ?Joan Flores, NP  ?

## 2021-09-12 ENCOUNTER — Encounter: Payer: Self-pay | Admitting: Behavioral Health

## 2021-09-12 MED ORDER — CARIPRAZINE HCL 3 MG PO CAPS: 3.0000 mg | ORAL_CAPSULE | Freq: Every day | ORAL | 1 refills | Status: DC

## 2021-09-12 NOTE — Progress Notes (Signed)
[name] ? ?Patient ID: Ubaldo Glassing,  ?MRN: [id number] ? ?PCP: Shon Baton, MD ? ?Date of Evaluation: 09/09/2021 ?Time spent:30 minutes ? ?Chief Complaint:  ?Chief Complaint   ?Anxiety; Depression; Fatigue; Medication Refill; Medication Problem; Follow-up ?  ? ? ?HISTORY/CURRENT STATUS: ?HPI ? ?26 year old male presents to this office for follow up. His mother is present with his consent. He says that he like Lybalvi and has been feeling much better on the medication but is concerned about the continued severe lethargy. He is also concerned about cost and would like to consider another medication at this time. Both his parents agree that he is better mentally. He says his anxiety is 2/10 and depression is 2/10 at this time. However his concern is he is sleeping 12 hours per day. He is going to bed around 1-2 am and sleeping into the afternoon. Says his parents are concerned that he has lack of motivation to work but he feels that he is still mending. He wants to start working out again with Cardio exercises. He says his weights are holding steady at 185 lbs but he has not gained anymore weight.  He denies any more psychosis, auditory or visual hallucinations. He denies SI/HI. ?  ?  ?Previous psychiatric medication trials: ? ?Abilify- says that he had experienced increased odd behavior according to mother and they stopped administration. Occurred in [place]? ?Olanzapine-Excessive weight gain. Pt does not want to continue ? Lybalvi-Excessive lethargy, daytime sleepiness and fatigue ? ? ?Individual Medical History/ Review of Systems: Changes? :No  ? ?Allergies: Patient has no known allergies. ? ?Current Medications:  ?Current Outpatient Medications:  ?  cariprazine (VRAYLAR) 3 MG capsule, Take 1 capsule (3 mg total) by mouth daily., Disp: 30 capsule, Rfl: 1 ?  albuterol (VENTOLIN HFA) 108 (90 Base) MCG/ACT inhaler, Inhale 2 puffs into the lungs See admin instructions. Inhale 2 puffs into the lungs every  4-6 hours as needed for shortness of breath or wheezing (Patient not taking: Reported on 05/02/2021), Disp: , Rfl:  ?  ascorbic acid (VITAMIN C) 500 MG tablet, Take 500-1,000 mg by mouth daily. (Patient not taking: Reported on 05/02/2021), Disp: , Rfl:  ?  aspirin EC 325 MG tablet, Take 325 mg by mouth every other day. (Patient not taking: Reported on 05/02/2021), Disp: , Rfl:  ?  calcium carbonate (TUMS - DOSED IN MG ELEMENTAL CALCIUM) 500 MG chewable tablet, Chew 1-2 tablets by mouth as needed for indigestion or heartburn.  (Patient not taking: Reported on 05/02/2021), Disp: , Rfl:  ?  COENZYME Q10 PO, Take 30 mg by mouth 3 (three) times daily. (Patient not taking: Reported on 05/02/2021), Disp: , Rfl:  ?  multivitamin (ONE-A-DAY MEN'S) TABS tablet, Take 1 tablet by mouth daily. (Patient not taking: Reported on 05/02/2021), Disp: , Rfl:  ?  NON FORMULARY, Take 2 capsules by mouth See admin instructions. [place] capsules; Take 2 capsules by mouth three times a week (Patient not taking: Reported on 05/02/2021), Disp: , Rfl:  ?  omeprazole (PRILOSEC) 40 MG capsule, Take 40 mg by mouth daily as needed (for reflux).  (Patient not taking: Reported on 05/02/2021), Disp: , Rfl:  ?  valACYclovir (VALTREX) 500 MG tablet, Take 500 mg by mouth daily. (Patient not taking: Reported on 07/12/2021), Disp: , Rfl:  ? ?Current Facility-Administered Medications:  ?  0.9 %  sodium chloride infusion, 500 mL, Intravenous, Continuous, Irene Shipper, MD ?Medication Side Effects: none ? ?Family Medical/ Social History:  Changes? No ? ?MENTAL HEALTH EXAM: ? ?There were no vitals taken for this visit.There is no height or weight on file to calculate BMI.  ?General Appearance: Casual, Neat, and Well Groomed  ?Eye Contact:  Good  ?Speech:  Clear and Coherent  ?Volume:  Normal  ?Mood:  Anxious, Depressed, and Dysphoric  ?Affect:  Congruent  ?Thought Process:  Coherent  ?Orientation:  Full (Time, Place, and Person)  ?Thought Content: Logical    ?Suicidal Thoughts:  No  ?Homicidal Thoughts:  No  ?Memory:  WNL  ?Judgement:  Fair  ?Insight:  Fair  ?Psychomotor Activity:  Normal  ?Concentration:  Concentration: Fair  ?Recall:  Good  ?Fund of Knowledge: Fair  ?Language: Fair  ?Assets:  Desire for Improvement  ?ADL's:  Intact  ?Cognition: WNL  ?Prognosis:  Good  ? ? ?DIAGNOSES:  ?  ICD-10-CM   ?1. Bipolar I disorder (HCC)  F31.9 cariprazine (VRAYLAR) 3 MG capsule  ?  ?2. Generalized anxiety disorder  F41.1 cariprazine (VRAYLAR) 3 MG capsule  ?  ? ? ?Receiving Psychotherapy: No  ? ? ?RECOMMENDATIONS:  ? ?Greater than 50% of 20  face to face time with patient was spent on counseling and coordination of care. Discussed his continued significant improvement with Lybalvi, but he he and his family continue to report extreme fatigue and sleepiness, lack of motivation or drive to find a job.  His weight has been stable and holding at 184 lbs. He is happy with the medication but cannot continue with fatigue and medication is too expensive. His parents are very concerned with his lethargy and no motivation. He is sleeping excessively. Explained to pt how to wean off Lybalvi and start Vraylar at the same time. Sample of Vraylar were provided with discount card.  ?Agreed to: ?  ?To reduce Lybalvi by 50 percent each week until finished ?To start Vraylar 1.5 mg for one week, the 3 mg daily ?Pt to utilized discount drug card. Will need to get PA. ?Will report side effects or worsening symptoms promptly ?To follow up in 4 weeks to reassess ?Order labs next visit ?Provided emergency contact information ?Discussed potential metabolic side effects associated with atypical antipsychotics, as well as potential risk for movement side effects. Advised pt to contact office if movement side effects occur.   ?Reviewed PDMP ?  ?  ? ? ? ? ? ? ?Elwanda Brooklyn, NP  ?

## 2021-10-06 ENCOUNTER — Ambulatory Visit (INDEPENDENT_AMBULATORY_CARE_PROVIDER_SITE_OTHER): Payer: BC Managed Care – PPO | Admitting: Behavioral Health

## 2021-10-06 ENCOUNTER — Encounter: Payer: Self-pay | Admitting: Behavioral Health

## 2021-10-06 DIAGNOSIS — F411 Generalized anxiety disorder: Secondary | ICD-10-CM

## 2021-10-06 DIAGNOSIS — F319 Bipolar disorder, unspecified: Secondary | ICD-10-CM | POA: Diagnosis not present

## 2021-10-06 NOTE — Progress Notes (Signed)
[name] ? ?Patient ID: Chase Mcneil,  ?MRN: [id number] ? ?PCP: Shon Baton, MD ? ?Date of Evaluation: 10/06/2021 ?Time spent:30 minutes ? ?Chief Complaint:  ?Chief Complaint   ?Anxiety; Depression; Follow-up; Medication Refill; Medication Problem ?  ? ? ?HISTORY/CURRENT STATUS: ?HPI ? ?26 year old male presents to this office for follow up. His mother is present with his consent. She says that he has turned around 100%. They are very happy with his new progress. Pt says that he wakes up now and wants to be active. He is more talkative and social. Looking for new job now.  He says his anxiety is 2/10 and depression is 2/10 at this time.He is sleeping about 8 hours per day now. He likes the [facility] and does not want to make any changes at this time. He denies any more psychosis, or mania. No auditory or visual hallucinations. He denies SI/HI. ?  ?Previous psychiatric medication trials: ?  ?Abilify- says that he had experienced increased odd behavior according to mother and they stopped administration. Occurred in [place]? ?Olanzapine-Excessive weight gain. Pt does not want to continue ? Lybalvi-Excessive lethargy, daytime sleepiness and fatigue ?  ? ? ?Individual Medical History/ Review of Systems: Changes? :No  ? ?Allergies: Patient has no known allergies. ? ?Current Medications:  ?Current Outpatient Medications:  ?  albuterol (VENTOLIN HFA) 108 (90 Base) MCG/ACT inhaler, Inhale 2 puffs into the lungs See admin instructions. Inhale 2 puffs into the lungs every 4-6 hours as needed for shortness of breath or wheezing (Patient not taking: Reported on 05/02/2021), Disp: , Rfl:  ?  ascorbic acid (VITAMIN C) 500 MG tablet, Take 500-1,000 mg by mouth daily. (Patient not taking: Reported on 05/02/2021), Disp: , Rfl:  ?  aspirin EC 325 MG tablet, Take 325 mg by mouth every other day. (Patient not taking: Reported on 05/02/2021), Disp: , Rfl:  ?  calcium carbonate (TUMS - DOSED IN MG ELEMENTAL CALCIUM) 500 MG  chewable tablet, Chew 1-2 tablets by mouth as needed for indigestion or heartburn.  (Patient not taking: Reported on 05/02/2021), Disp: , Rfl:  ?  cariprazine (VRAYLAR) 3 MG capsule, Take 1 capsule (3 mg total) by mouth daily., Disp: 30 capsule, Rfl: 1 ?  COENZYME Q10 PO, Take 30 mg by mouth 3 (three) times daily. (Patient not taking: Reported on 05/02/2021), Disp: , Rfl:  ?  multivitamin (ONE-A-DAY MEN'S) TABS tablet, Take 1 tablet by mouth daily. (Patient not taking: Reported on 05/02/2021), Disp: , Rfl:  ?  NON FORMULARY, Take 2 capsules by mouth See admin instructions. [place] capsules; Take 2 capsules by mouth three times a week (Patient not taking: Reported on 05/02/2021), Disp: , Rfl:  ?  omeprazole (PRILOSEC) 40 MG capsule, Take 40 mg by mouth daily as needed (for reflux).  (Patient not taking: Reported on 05/02/2021), Disp: , Rfl:  ?  valACYclovir (VALTREX) 500 MG tablet, Take 500 mg by mouth daily. (Patient not taking: Reported on 07/12/2021), Disp: , Rfl:  ? ?Current Facility-Administered Medications:  ?  0.9 %  sodium chloride infusion, 500 mL, Intravenous, Continuous, Irene Shipper, MD ?Medication Side Effects: none ? ?Family Medical/ Social History: Changes? No ? ?MENTAL HEALTH EXAM: ? ?There were no vitals taken for this visit.There is no height or weight on file to calculate BMI.  ?General Appearance: Casual and Neat  ?Eye Contact:  Good  ?Speech:  Clear and Coherent  ?Volume:  Normal  ?Mood:  Angry  ?Affect:  Appropriate  ?  Thought Process:  Coherent  ?Orientation:  Full (Time, Place, and Person)  ?Thought Content: Logical   ?Suicidal Thoughts:  No  ?Homicidal Thoughts:  No  ?Memory:  WNL  ?Judgement:  Good  ?Insight:  Good  ?Psychomotor Activity:  Normal  ?Concentration:  Concentration: Good  ?Recall:  Good  ?Fund of Knowledge: Good  ?Language: Good  ?Assets:  Desire for Improvement  ?ADL's:  Intact  ?Cognition: WNL  ?Prognosis:  Good  ? ? ?DIAGNOSES: No diagnosis found. ? ?Receiving Psychotherapy:  No  ? ? ?RECOMMENDATIONS:  ? ?Greater than 50% of 20  face to face time with patient was spent on counseling and coordination of care. He successfully started Vraylar and weaned off the Lybalvi. He report a 100% turn around. He is now waking up and desires to participate in activities. Having increased energy and feeling more social. He is now looking for a new job. He is very thankful in switching medications and finding one that is working well.  ? ?Agreed to: ?To Continue Vraylar 3 mg daily ?Pt to utilized discount drug card ?Will report side effects or worsening symptoms promptly ?To follow up in 4 weeks to reassess ?Order labs next visit ?Provided emergency contact information ?Discussed potential metabolic side effects associated with atypical antipsychotics, as well as potential risk for movement side effects. Advised pt to contact office if movement side effects occur.   ?His PCP recently conducted labs but have not received results yet. Will review as soon as received.  ?Reviewed PDMP ?  ?  ? ? ? ? ? ?Elwanda Brooklyn, NP  ?

## 2021-11-08 ENCOUNTER — Telehealth: Payer: Self-pay | Admitting: Behavioral Health

## 2021-11-08 NOTE — Telephone Encounter (Signed)
Mom called at 10:48 am. She said the medicine is working great. However last night Glennie said he felt very stressed and wanted a xanax. Mom did not give it to him because she told him she needed brian's approval.Also they are going to [place] for 5 weeks and he will need samples to get him through his trip. Mom wants to know is it ok for [place] to have 1 to 2 drinks while he is in [place] on his medications. Please call  mom at [phone] ?

## 2021-11-08 NOTE — Telephone Encounter (Signed)
Rtc and spoke with Dad, he reports Braylin got upset last night about not having a job and became very anxious about it. Reports pt asked for a Xanax but parents wanted to make sure it's okay to give during something like that occasionally? He does have Xanax to take prior to flying.  ? ? ?Also reports Mom and Mozell are leaving for [place] on 01/16/2022 through September, 5-6 weeks and wants to make sure they have plenty of medication to take with them.  ?

## 2021-11-08 NOTE — Telephone Encounter (Signed)
Yes, please advise them to use sparingly but ok to use in situations like that.

## 2021-11-09 ENCOUNTER — Other Ambulatory Visit: Payer: Self-pay

## 2021-11-09 ENCOUNTER — Ambulatory Visit: Payer: BC Managed Care – PPO | Admitting: Behavioral Health

## 2021-11-09 DIAGNOSIS — F411 Generalized anxiety disorder: Secondary | ICD-10-CM

## 2021-11-09 DIAGNOSIS — F319 Bipolar disorder, unspecified: Secondary | ICD-10-CM

## 2021-11-09 MED ORDER — CARIPRAZINE HCL 3 MG PO CAPS: 3.0000 mg | ORAL_CAPSULE | Freq: Every day | ORAL | 1 refills | Status: DC

## 2021-11-09 NOTE — Telephone Encounter (Signed)
Rtc to Dad and gave him information, he was appreciative. He asked about medication. Reports the [employer] will be $1500.00. I asked if he took the copay card and he thought they had. I have not done a PA for his medication but it may not need it. ?I contacted his pharmacy and they report he picked it up on 10/27/2021 and it was $0. Pharmacist couldn't run it for the next refill. Plus they need a new Rx. I sent a new Rx so I will follow up when refill due.There are 2 copay cards on file.  ? ?He asked about samples and I informed him once I check into the pharmacy a little more I would let him know about getting samples.  ?

## 2021-11-10 NOTE — Telephone Encounter (Signed)
Ok, thank you for keeping me up to date. I will wait to hear back from you. Let me know if there is anything I need to do.  ?
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
## 2021-11-17 ENCOUNTER — Ambulatory Visit: Payer: BC Managed Care – PPO | Admitting: Behavioral Health

## 2021-11-17 ENCOUNTER — Ambulatory Visit (INDEPENDENT_AMBULATORY_CARE_PROVIDER_SITE_OTHER): Payer: BC Managed Care – PPO | Admitting: Behavioral Health

## 2021-11-17 ENCOUNTER — Encounter: Payer: Self-pay | Admitting: Behavioral Health

## 2021-11-17 DIAGNOSIS — F411 Generalized anxiety disorder: Secondary | ICD-10-CM | POA: Diagnosis not present

## 2021-11-17 DIAGNOSIS — F319 Bipolar disorder, unspecified: Secondary | ICD-10-CM | POA: Diagnosis not present

## 2021-11-17 MED ORDER — CARIPRAZINE HCL 3 MG PO CAPS: 3.0000 mg | ORAL_CAPSULE | Freq: Every day | ORAL | 3 refills | Status: DC

## 2021-11-17 NOTE — Progress Notes (Signed)
[name]  Patient ID: Chase Mcneil,  MRN: [id number]  PCP: Creola Corn, MD  Date of Evaluation: 11/17/2021 Time spent:20 minutes  Chief Complaint:  Chief Complaint   Anxiety; Depression; Follow-up; Medication Refill     HISTORY/CURRENT STATUS: HPI 26 year old male presents to this office for follow up. His mother is present with his consent. She says that he has turned around 100%. They are very happy with his new progress. Pt says that he wakes up now and wants to be active. He is more talkative and social. Working new part time job. Trying to figure out what he wants to do long term. He says his anxiety is 1/10 and depression is 1/10 at this time.He is sleeping about 8 hours per day now. He likes the medication and does not want to make any changes at this time. He denies any more psychosis, or mania. No auditory or visual hallucinations. He denies SI/HI.   Previous psychiatric medication trials:   Abilify- says that he had experienced increased odd behavior according to mother and they stopped administration. Occurred in [place]? Olanzapine-Excessive weight gain. Pt does not want to continue  Lybalvi-Excessive lethargy, daytime sleepiness and fatigue     Individual Medical History/ Review of Systems: Changes? :No   Allergies: Patient has no known allergies.  Current Medications:  Current Outpatient Medications:    albuterol (VENTOLIN HFA) 108 (90 Base) MCG/ACT inhaler, Inhale 2 puffs into the lungs See admin instructions. Inhale 2 puffs into the lungs every 4-6 hours as needed for shortness of breath or wheezing (Patient not taking: Reported on 05/02/2021), Disp: , Rfl:    ascorbic acid (VITAMIN C) 500 MG tablet, Take 500-1,000 mg by mouth daily. (Patient not taking: Reported on 05/02/2021), Disp: , Rfl:    aspirin EC 325 MG tablet, Take 325 mg by mouth every other day. (Patient not taking: Reported on 05/02/2021), Disp: , Rfl:    calcium carbonate (TUMS -  DOSED IN MG ELEMENTAL CALCIUM) 500 MG chewable tablet, Chew 1-2 tablets by mouth as needed for indigestion or heartburn.  (Patient not taking: Reported on 05/02/2021), Disp: , Rfl:    cariprazine (VRAYLAR) 3 MG capsule, Take 1 capsule (3 mg total) by mouth daily., Disp: 30 capsule, Rfl: 3   COENZYME Q10 PO, Take 30 mg by mouth 3 (three) times daily. (Patient not taking: Reported on 05/02/2021), Disp: , Rfl:    multivitamin (ONE-A-DAY MEN'S) TABS tablet, Take 1 tablet by mouth daily. (Patient not taking: Reported on 05/02/2021), Disp: , Rfl:    NON FORMULARY, Take 2 capsules by mouth See admin instructions. [place] capsules; Take 2 capsules by mouth three times a week (Patient not taking: Reported on 05/02/2021), Disp: , Rfl:    omeprazole (PRILOSEC) 40 MG capsule, Take 40 mg by mouth daily as needed (for reflux).  (Patient not taking: Reported on 05/02/2021), Disp: , Rfl:    valACYclovir (VALTREX) 500 MG tablet, Take 500 mg by mouth daily. (Patient not taking: Reported on 07/12/2021), Disp: , Rfl:   Current Facility-Administered Medications:    0.9 %  sodium chloride infusion, 500 mL, Intravenous, Continuous, Hilarie Fredrickson, MD Medication Side Effects: none  Family Medical/ Social History: Changes? No  MENTAL HEALTH EXAM:  There were no vitals taken for this visit.There is no height or weight on file to calculate BMI.  General Appearance: Casual and Neat  Eye Contact:  Good  Speech:  Clear and Coherent  Volume:  Normal  Mood:  NA  Affect:  Appropriate  Thought Process:  Coherent  Orientation:  Full (Time, Place, and Person)  Thought Content: Logical   Suicidal Thoughts:  No  Homicidal Thoughts:  No  Memory:  WNL  Judgement:  Good  Insight:  Good  Psychomotor Activity:  Normal  Concentration:  Concentration: Good  Recall:  Good  Fund of Knowledge: Fair  Language: Fair  Assets:  Desire for Improvement Resilience Social Support Talents/Skills  ADL's:  Intact  Cognition: WNL   Prognosis:  Good    DIAGNOSES:    ICD-10-CM   1. Bipolar I disorder (HCC)  F31.9 cariprazine (VRAYLAR) 3 MG capsule    2. Generalized anxiety disorder  F41.1 cariprazine (VRAYLAR) 3 MG capsule      Receiving Psychotherapy:   Greater than 50% of 20  face to face time with patient was spent on counseling and coordination of care. He really likes [employer]. He is continuing to experience increased energy levels and feeling more social. He is currently working  part time job. He is very thankful in switching medications and finding one that is working well.    Agreed to: To Continue Vraylar 3 mg daily Pt to utilized discount drug card Will report side effects or worsening symptoms promptly To follow up in 4 weeks to reassess Order labs next visit Provided emergency contact information Discussed potential metabolic side effects associated with atypical antipsychotics, as well as potential risk for movement side effects. Advised pt to contact office if movement side effects occur.   His PCP recently conducted labs but have not received results yet. Will review as soon as received.  Reviewed PDMP            RECOMMENDATIONS:    Joan Flores, NP

## 2021-11-21 ENCOUNTER — Telehealth: Payer: Self-pay | Admitting: Behavioral Health

## 2021-11-21 NOTE — Telephone Encounter (Signed)
Dad LVM earlier but isn't on the DPR.  Spoke to pt.  Mom and dad were in the background but she isn't on the DRP either.  He needs more samples of Vraylar 3mg . He is going out of the country from May 27 to Sept 1 with his mom.  He only has a couple days of meds left.  They previously used the 2 copay cards and got the med free, but can't use the card any more.    They are asking for samples in time for his trip. If you can provide samples, pls call them when they are ready to be picked up.   They also need to know what they can do long term because they can't afford $1500 a month script.    Next appt 9/5

## 2022-01-09 ENCOUNTER — Telehealth: Payer: Self-pay | Admitting: Behavioral Health

## 2022-01-09 NOTE — Telephone Encounter (Signed)
Please approve

## 2022-01-09 NOTE — Telephone Encounter (Signed)
Patient's mom called in with Chase Mcneil regarding medication. Stating that they will be leaving for [place] in about two weeks and he would like to have his Xanax filled for about ten pills for the flight there and back for his anxiety. Pls rtc if needed [phone] Appt 9/5. Pharmacy [facility] [address] Jacky Kindle

## 2022-01-10 ENCOUNTER — Other Ambulatory Visit: Payer: Self-pay | Admitting: Behavioral Health

## 2022-01-10 DIAGNOSIS — F411 Generalized anxiety disorder: Secondary | ICD-10-CM

## 2022-01-10 MED ORDER — ALPRAZOLAM 0.5 MG PO TABS: ORAL_TABLET | ORAL | 0 refills | Status: DC

## 2022-01-10 NOTE — Telephone Encounter (Signed)
Please advise pt RX sent

## 2022-01-10 NOTE — Telephone Encounter (Signed)
LVM

## 2022-01-17 ENCOUNTER — Other Ambulatory Visit: Payer: Self-pay

## 2022-01-17 ENCOUNTER — Telehealth: Payer: Self-pay | Admitting: Behavioral Health

## 2022-01-17 NOTE — Telephone Encounter (Signed)
Chase Mcneil  lvm requesting Xanax 0.5 mg. Patient stated he need as soon as possible, due to an upcoming trip to [place].  Fill at [id number] [place], [place] - [address] AT [facility] [address]  [address] [place] [address]  Phone:  [phone]  Fax:  [phone]   Next appt. 03/07/22

## 2022-01-17 NOTE — Telephone Encounter (Signed)
Informed dad rx has been sent  

## 2022-03-07 ENCOUNTER — Encounter: Payer: Self-pay | Admitting: Behavioral Health

## 2022-03-07 ENCOUNTER — Ambulatory Visit (INDEPENDENT_AMBULATORY_CARE_PROVIDER_SITE_OTHER): Payer: BC Managed Care – PPO | Admitting: Behavioral Health

## 2022-03-07 DIAGNOSIS — F411 Generalized anxiety disorder: Secondary | ICD-10-CM

## 2022-03-07 DIAGNOSIS — F319 Bipolar disorder, unspecified: Secondary | ICD-10-CM | POA: Diagnosis not present

## 2022-03-07 NOTE — Progress Notes (Signed)
[name]  Patient ID: EIN RIJO,  MRN: [id number]  PCP: Creola Corn, MD  Date of Evaluation: 03/07/2022 Time spent:30 minutes  Chief Complaint:  Chief Complaint   Anxiety; Depression; Follow-up; Medication Refill; Patient Education     HISTORY/CURRENT STATUS: HPI  26 year old male presents to this office for follow up. His mother is present with his consent. She says that he has turned around 100%. They are very happy with his new progress. Pt says that he wakes up now and wants to be active. He is more talkative and social. Working new part time job. He just got back from extended vacation in [place] and had a great time. He would like to continue on his current regimen.  Trying to figure out what he wants to do long term. He says his anxiety is 1/10 and depression is 1/10 at this time.He is sleeping about 8 hours per day now. He likes the medication and does not want to make any changes at this time. He denies any more psychosis, or mania. No auditory or visual hallucinations. He denies SI/HI.   Previous psychiatric medication trials:   Abilify- says that he had experienced increased odd behavior according to mother and they stopped administration. Occurred in [place]? Olanzapine-Excessive weight gain. Pt does not want to continue  Lybalvi-Excessive lethargy, daytime sleepiness and fatigue     Individual Medical History/ Review of Systems: Changes? :Yes   Allergies: Patient has no known allergies.  Current Medications:  Current Outpatient Medications:    albuterol (VENTOLIN HFA) 108 (90 Base) MCG/ACT inhaler, Inhale 2 puffs into the lungs See admin instructions. Inhale 2 puffs into the lungs every 4-6 hours as needed for shortness of breath or wheezing (Patient not taking: Reported on 05/02/2021), Disp: , Rfl:    ALPRAZolam (XANAX) 0.5 MG tablet, 1-2 tablets daily for severe anxiety or before flying., Disp: 10 tablet, Rfl: 0   ascorbic acid (VITAMIN C) 500 MG  tablet, Take 500-1,000 mg by mouth daily. (Patient not taking: Reported on 05/02/2021), Disp: , Rfl:    aspirin EC 325 MG tablet, Take 325 mg by mouth every other day. (Patient not taking: Reported on 05/02/2021), Disp: , Rfl:    calcium carbonate (TUMS - DOSED IN MG ELEMENTAL CALCIUM) 500 MG chewable tablet, Chew 1-2 tablets by mouth as needed for indigestion or heartburn.  (Patient not taking: Reported on 05/02/2021), Disp: , Rfl:    cariprazine (VRAYLAR) 3 MG capsule, Take 1 capsule (3 mg total) by mouth daily., Disp: 30 capsule, Rfl: 3   COENZYME Q10 PO, Take 30 mg by mouth 3 (three) times daily. (Patient not taking: Reported on 05/02/2021), Disp: , Rfl:    multivitamin (ONE-A-DAY MEN'S) TABS tablet, Take 1 tablet by mouth daily. (Patient not taking: Reported on 05/02/2021), Disp: , Rfl:    NON FORMULARY, Take 2 capsules by mouth See admin instructions. Sea Moss capsules; Take 2 capsules by mouth three times a week (Patient not taking: Reported on 05/02/2021), Disp: , Rfl:    omeprazole (PRILOSEC) 40 MG capsule, Take 40 mg by mouth daily as needed (for reflux).  (Patient not taking: Reported on 05/02/2021), Disp: , Rfl:    valACYclovir (VALTREX) 500 MG tablet, Take 500 mg by mouth daily. (Patient not taking: Reported on 07/12/2021), Disp: , Rfl:   Current Facility-Administered Medications:    0.9 %  sodium chloride infusion, 500 mL, Intravenous, Continuous, Hilarie Fredrickson, MD Medication Side Effects: none  Family Medical/ Social History:  Changes? No  MENTAL HEALTH EXAM:  There were no vitals taken for this visit.There is no height or weight on file to calculate BMI.  General Appearance: Casual and Neat  Eye Contact:  Absent  Speech:  Clear and Coherent  Volume:  Normal  Mood:  NA  Affect:  Appropriate  Thought Process:  Coherent  Orientation:  Full (Time, Place, and Person)  Thought Content: WDL   Suicidal Thoughts:  No  Homicidal Thoughts:  No  Memory:  WNL  Judgement:  Good   Insight:  Good  Psychomotor Activity:  Normal  Concentration:  Concentration: Good  Recall:  Good  Fund of Knowledge: Good  Language: Good  Assets:  Desire for Improvement  ADL's:  Intact  Cognition: WNL  Prognosis:  Good    DIAGNOSES:    ICD-10-CM   1. Generalized anxiety disorder  F41.1     2. Bipolar I disorder (HCC)  F31.9       Receiving Psychotherapy: No    RECOMMENDATIONS:   Greater than 50% of 20  face to face time with patient was spent on counseling and coordination of care. He really likes Wellsite geologist. He is continuing to experience increased energy levels and feeling more social.  He and his family just back from [place]. He is currently working  part time job.  He is very thankful in switching medications and finding one that is working well.    Agreed to: To Continue Vraylar 3 mg daily Will report side effects or worsening symptoms promptly To follow up in 3 months to reassess Reviewed recent labs Provided emergency contact information Discussed potential metabolic side effects associated with atypical antipsychotics, as well as potential risk for movement side effects. Advised pt to contact office if movement side effects occur.   His PCP recently conducted labs but have not received results yet. Will review as soon as received.  Reviewed PDMP         Joan Flores, NP

## 2022-04-05 ENCOUNTER — Telehealth: Payer: Self-pay | Admitting: Behavioral Health

## 2022-04-05 ENCOUNTER — Other Ambulatory Visit: Payer: Self-pay

## 2022-04-05 DIAGNOSIS — F411 Generalized anxiety disorder: Secondary | ICD-10-CM

## 2022-04-05 DIAGNOSIS — F319 Bipolar disorder, unspecified: Secondary | ICD-10-CM

## 2022-04-05 MED ORDER — CARIPRAZINE HCL 3 MG PO CAPS: 3.0000 mg | ORAL_CAPSULE | Freq: Every day | ORAL | 3 refills | Status: DC

## 2022-04-05 NOTE — Telephone Encounter (Signed)
Rx sent should we give samples also?

## 2022-04-05 NOTE — Telephone Encounter (Signed)
He has been on this medication for a while. We cannot do more samples at this point.

## 2022-04-05 NOTE — Telephone Encounter (Signed)
Mom called and asked for a refill on Sorren vraylar 3 mg  with 3 refills . [facility] [address]. She also said that you gave her samples along with the script and wants to to do that again as well.

## 2022-06-06 ENCOUNTER — Ambulatory Visit (INDEPENDENT_AMBULATORY_CARE_PROVIDER_SITE_OTHER): Payer: BC Managed Care – PPO | Admitting: Behavioral Health

## 2022-06-06 ENCOUNTER — Encounter: Payer: Self-pay | Admitting: Behavioral Health

## 2022-06-06 DIAGNOSIS — F319 Bipolar disorder, unspecified: Secondary | ICD-10-CM | POA: Diagnosis not present

## 2022-06-06 DIAGNOSIS — F411 Generalized anxiety disorder: Secondary | ICD-10-CM

## 2022-06-06 MED ORDER — CARIPRAZINE HCL 1.5 MG PO CAPS: 1.5000 mg | ORAL_CAPSULE | Freq: Every day | ORAL | 3 refills | Status: DC

## 2022-06-06 NOTE — Progress Notes (Signed)
[name]  Patient ID: Chase Mcneil,  MRN: [id number]  PCP: Creola Corn, MD  Date of Evaluation: 06/06/2022 Time spent:30 minutes  Chief Complaint:  Chief Complaint   Depression; Anxiety; Follow-up; Medication Refill; Patient Education     HISTORY/CURRENT STATUS: HPI 26 year old male presents to this office for follow up. His mother is present with his consent. She says that he has turned around 100%. They are very happy with his new progress. Pt says that he wakes up now and wants to be active. He is more talkative and social. 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  He would like to continue on his current regimen.  Trying to figure out what he wants to do long term. He says his anxiety is 1/10 and depression is 1/10 at this time.He is sleeping about 8 hours per day now.  He is working out 3-4 days per week at [employer]. He likes the medication and does not want to make any changes at this time. He denies any more psychosis, or mania. No auditory or visual hallucinations. He denies SI/HI.   Previous psychiatric medication trials:   Abilify- says that he had experienced increased odd behavior according to mother and they stopped administration. Occurred in [place]? Olanzapine-Excessive weight gain. Pt does not want to continue  Lybalvi-Excessive lethargy, daytime sleepiness and fatigue  Individual Medical History/ Review of Systems: Changes? :No   Allergies: Patient has no known allergies.  Current Medications:  Current Outpatient Medications:    cariprazine (VRAYLAR) 1.5 MG capsule, Take 1 capsule (1.5 mg total) by mouth daily., Disp: 30 capsule, Rfl: 3   albuterol (VENTOLIN HFA) 108 (90 Base) MCG/ACT inhaler, Inhale 2 puffs into the lungs See admin instructions. Inhale 2 puffs into the lungs every 4-6 hours as needed for shortness of breath or wheezing (Patient not taking: Reported on 05/02/2021), Disp: , Rfl:    ALPRAZolam (XANAX) 0.5 MG tablet, 1-2  tablets daily for severe anxiety or before flying., Disp: 10 tablet, Rfl: 0   ascorbic acid (VITAMIN C) 500 MG tablet, Take 500-1,000 mg by mouth daily. (Patient not taking: Reported on 05/02/2021), Disp: , Rfl:    aspirin EC 325 MG tablet, Take 325 mg by mouth every other day. (Patient not taking: Reported on 05/02/2021), Disp: , Rfl:    calcium carbonate (TUMS - DOSED IN MG ELEMENTAL CALCIUM) 500 MG chewable tablet, Chew 1-2 tablets by mouth as needed for indigestion or heartburn.  (Patient not taking: Reported on 05/02/2021), Disp: , Rfl:    cariprazine (VRAYLAR) 3 MG capsule, Take 1 capsule (3 mg total) by mouth daily., Disp: 30 capsule, Rfl: 3   COENZYME Q10 PO, Take 30 mg by mouth 3 (three) times daily. (Patient not taking: Reported on 05/02/2021), Disp: , Rfl:    multivitamin (ONE-A-DAY MEN'S) TABS tablet, Take 1 tablet by mouth daily. (Patient not taking: Reported on 05/02/2021), Disp: , Rfl:    NON FORMULARY, Take 2 capsules by mouth See admin instructions. Sea Moss capsules; Take 2 capsules by mouth three times a week (Patient not taking: Reported on 05/02/2021), Disp: , Rfl:    omeprazole (PRILOSEC) 40 MG capsule, Take 40 mg by mouth daily as needed (for reflux).  (Patient not taking: Reported on 05/02/2021), Disp: , Rfl:    valACYclovir (VALTREX) 500 MG tablet, Take 500 mg by mouth daily. (Patient not taking: Reported on 07/12/2021), Disp: , Rfl:   Current Facility-Administered Medications:    0.9 %  sodium chloride infusion, 500 mL, Intravenous, Continuous, Irene Shipper, MD Medication Side Effects: none  Family Medical/ Social History: Changes? No  MENTAL HEALTH EXAM:  There were no vitals taken for this visit.There is no height or weight on file to calculate BMI.  General Appearance: Casual, Neat, and Well Groomed  Eye Contact:  Good  Speech:  Clear and Coherent  Volume:  Normal  Mood:  NA  Affect:  Appropriate  Thought Process:  Coherent  Orientation:  Full (Time, Place, and  Person)  Thought Content: Logical   Suicidal Thoughts:  No  Homicidal Thoughts:  No  Memory:  WNL  Judgement:  Good  Insight:  Good  Psychomotor Activity:  Normal  Concentration:  Concentration: Good  Recall:  Good  Fund of Knowledge: Good  Language: Good  Assets:  Desire for Improvement  ADL's:  Intact  Cognition: WNL  Prognosis:  Good    DIAGNOSES: No diagnosis found.  Receiving Psychotherapy: No    RECOMMENDATIONS:   Greater than 50% of 30  face to face time with patient was spent on counseling and coordination of care. He really likes Dietitian. He is continuing to experience increased energy levels and feeling more social. He is currently looking for new job. He and his family just back from [place]. Marland Kitchen  He is very thankful in switching medications and finding one that is working well. We discussed his desire to reduce Vraylar to 1.5 mg.   Agreed to: To reduce Vraylar to 1.5 mg daily Will report side effects or worsening symptoms promptly To follow up in 3 months to reassess Reviewed recent labs Provided emergency contact information Discussed potential metabolic side effects associated with atypical antipsychotics, as well as potential risk for movement side effects. Advised pt to contact office if movement side effects occur.   His PCP recently conducted labs but have not received results yet. Will review as soon as received.  Reviewed PDMP    Elwanda Brooklyn, NP

## 2022-09-05 ENCOUNTER — Ambulatory Visit (INDEPENDENT_AMBULATORY_CARE_PROVIDER_SITE_OTHER): Payer: BC Managed Care – PPO | Admitting: Behavioral Health

## 2022-09-05 ENCOUNTER — Encounter: Payer: Self-pay | Admitting: Behavioral Health

## 2022-09-05 DIAGNOSIS — F319 Bipolar disorder, unspecified: Secondary | ICD-10-CM | POA: Diagnosis not present

## 2022-09-05 DIAGNOSIS — F411 Generalized anxiety disorder: Secondary | ICD-10-CM | POA: Diagnosis not present

## 2022-09-05 NOTE — Progress Notes (Signed)
[name]  Patient ID: Chase Mcneil,  MRN: [id number]  PCP: Shon Baton, MD  Date of Evaluation: 09/05/2022 Time spent:30 minutes  Chief Complaint:  Chief Complaint   Anxiety; Depression     HISTORY/CURRENT STATUS: HPI 27 year old male presents to this office for follow up. His mother is present with his consent. No changes this visit. Say he feels good and is continuing to enjoy great stability.  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  He would like to continue on his current regimen.  Trying to figure out what he wants to do long term. Says he would like to talk about weaning off the medication next visit.  He says his anxiety is 1/10 and depression is 1/10 at this time.He is sleeping about 8 hours per day now.  He is working out 3-4 days per week at [employer]. He denies any more psychosis, or mania. No auditory or visual hallucinations. He denies SI/HI.   Previous psychiatric medication trials:   Abilify- says that he had experienced increased odd behavior according to mother and they stopped administration. Occurred in [place]? Olanzapine-Excessive weight gain. Pt does not want to continue  Lybalvi-Excessive lethargy, daytime sleepiness and fatigue       Individual Medical History/ Review of Systems: Changes? :No   Allergies: Patient has no known allergies.  Current Medications:  Current Outpatient Medications:    albuterol (VENTOLIN HFA) 108 (90 Base) MCG/ACT inhaler, Inhale 2 puffs into the lungs See admin instructions. Inhale 2 puffs into the lungs every 4-6 hours as needed for shortness of breath or wheezing (Patient not taking: Reported on 05/02/2021), Disp: , Rfl:    ALPRAZolam (XANAX) 0.5 MG tablet, 1-2 tablets daily for severe anxiety or before flying., Disp: 10 tablet, Rfl: 0   ascorbic acid (VITAMIN C) 500 MG tablet, Take 500-1,000 mg by mouth daily. (Patient not taking: Reported on 05/02/2021), Disp: , Rfl:    aspirin EC 325 MG tablet,  Take 325 mg by mouth every other day. (Patient not taking: Reported on 05/02/2021), Disp: , Rfl:    calcium carbonate (TUMS - DOSED IN MG ELEMENTAL CALCIUM) 500 MG chewable tablet, Chew 1-2 tablets by mouth as needed for indigestion or heartburn.  (Patient not taking: Reported on 05/02/2021), Disp: , Rfl:    cariprazine (VRAYLAR) 1.5 MG capsule, Take 1 capsule (1.5 mg total) by mouth daily., Disp: 30 capsule, Rfl: 3   cariprazine (VRAYLAR) 3 MG capsule, Take 1 capsule (3 mg total) by mouth daily., Disp: 30 capsule, Rfl: 3   COENZYME Q10 PO, Take 30 mg by mouth 3 (three) times daily. (Patient not taking: Reported on 05/02/2021), Disp: , Rfl:    multivitamin (ONE-A-DAY MEN'S) TABS tablet, Take 1 tablet by mouth daily. (Patient not taking: Reported on 05/02/2021), Disp: , Rfl:    NON FORMULARY, Take 2 capsules by mouth See admin instructions. Sea Moss capsules; Take 2 capsules by mouth three times a week (Patient not taking: Reported on 05/02/2021), Disp: , Rfl:    omeprazole (PRILOSEC) 40 MG capsule, Take 40 mg by mouth daily as needed (for reflux).  (Patient not taking: Reported on 05/02/2021), Disp: , Rfl:    valACYclovir (VALTREX) 500 MG tablet, Take 500 mg by mouth daily. (Patient not taking: Reported on 07/12/2021), Disp: , Rfl:   Current Facility-Administered Medications:    0.9 %  sodium chloride infusion, 500 mL, Intravenous, Continuous, Irene Shipper, MD Medication Side Effects: none  Family Medical/  Social History: Changes? No  MENTAL HEALTH EXAM:  There were no vitals taken for this visit.There is no height or weight on file to calculate BMI.  General Appearance: Casual and Well Groomed  Eye Contact:  Good  Speech:  Clear and Coherent  Volume:  Normal  Mood:  NA  Affect:  Appropriate  Thought Process:  Coherent  Orientation:  Full (Time, Place, and Person)  Thought Content: Logical   Suicidal Thoughts:  No  Homicidal Thoughts:  No  Memory:  WNL  Judgement:  Good  Insight:   Good  Psychomotor Activity:  Normal  Concentration:  Concentration: Good  Recall:  Good  Fund of Knowledge: Good  Language: Good  Assets:  Desire for Improvement  ADL's:  Intact  Cognition: WNL  Prognosis:  Good    DIAGNOSES:    ICD-10-CM   1. Bipolar I disorder ([place])  F31.9     2. Generalized anxiety disorder  F41.1       Receiving Psychotherapy: No    RECOMMENDATIONS:    Greater than 50% of 30  face to face time with patient was spent on counseling and coordination of care. No changes this visit. He really likes Dietitian. He is continuing to experience increased energy levels and feeling more social. He is currently looking for new job.  well.  We discussed his desire to reduce Vraylar to 1.5 mg. Pt would like to wean off the medication because he feel that he has been stable for over one year. To discuss further next visit.    Agreed to: Continue Vraylar to 1.5 mg daily Will report side effects or worsening symptoms promptly To follow up in 3 months to reassess Reviewed recent labs Provided emergency contact information Discussed potential metabolic side effects associated with atypical antipsychotics, as well as potential risk for movement side effects. Advised pt to contact office if movement side effects occur.   His PCP recently conducted labs but have not received results yet. Will review as soon as received.  Reviewed PDMP      Elwanda Brooklyn, NP

## 2022-10-17 ENCOUNTER — Other Ambulatory Visit: Payer: Self-pay | Admitting: Behavioral Health

## 2022-11-30 ENCOUNTER — Encounter: Payer: Self-pay | Admitting: Internal Medicine

## 2022-11-30 ENCOUNTER — Telehealth: Payer: Self-pay | Admitting: Internal Medicine

## 2022-11-30 NOTE — Telephone Encounter (Signed)
There is an appt with Colleen on 8/19 at 9am. That is the only other thing. He can call back and try to get a sooner appt next week when some of the 7 day holds are up.

## 2022-11-30 NOTE — Telephone Encounter (Signed)
Urgent referral from PCP for diarrhea and abd pain.  I spoke with patient and he requested to schedule appt for 03/01/23 to get on the books.  However he stated "it is not acceptable and I would like to be seen sooner".  Last saw Dr. Marina Goodell in 2018.  Please advise?  Thanks

## 2022-11-30 NOTE — Telephone Encounter (Signed)
Called and left a detailed message on patient's voicemail.  He is scheduled for 03/01/2023 and placed on waitlist.  Paperwork mailed

## 2022-12-06 ENCOUNTER — Ambulatory Visit (INDEPENDENT_AMBULATORY_CARE_PROVIDER_SITE_OTHER): Payer: BC Managed Care – PPO | Admitting: Behavioral Health

## 2022-12-06 ENCOUNTER — Encounter: Payer: Self-pay | Admitting: Behavioral Health

## 2022-12-06 DIAGNOSIS — F319 Bipolar disorder, unspecified: Secondary | ICD-10-CM | POA: Diagnosis not present

## 2022-12-06 DIAGNOSIS — F411 Generalized anxiety disorder: Secondary | ICD-10-CM

## 2022-12-06 NOTE — Progress Notes (Signed)
[name]  Patient ID: Chase Mcneil,  MRN: [id number]  PCP: Creola Corn, MD  Date of Evaluation: 12/06/2022 Time spent:30 minutes  Chief Complaint:  Chief Complaint   Anxiety; Depression; Follow-up; Patient Education     HISTORY/CURRENT STATUS: HPI 27 year old male presents to this office for follow up. His mother is present with his consent. No changes this visit. Say he feels good and is continuing to enjoy great stability.  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  He is still wanting to try to wean off medication and feels like his episode over one year ago was from substances. He really wants to stop medication.  He says his anxiety is 1/10 and depression is 1/10 at this time.He is sleeping about 8 hours per day now.  He is working out 3-4 days per week at [employer]. He denies any more psychosis, or mania. No auditory or visual hallucinations. He denies SI/HI.   Previous psychiatric medication trials:   Abilify- says that he had experienced increased odd behavior according to mother and they stopped administration. Occurred in [place]? Olanzapine-Excessive weight gain. Pt does not want to continue  Lybalvi-Excessive lethargy, daytime sleepiness and fatigue       Individual Medical History/ Review of Systems: Changes? :No   Allergies: Patient has no known allergies.  Current Medications:  Current Outpatient Medications:    albuterol (VENTOLIN HFA) 108 (90 Base) MCG/ACT inhaler, Inhale 2 puffs into the lungs See admin instructions. Inhale 2 puffs into the lungs every 4-6 hours as needed for shortness of breath or wheezing (Patient not taking: Reported on 05/02/2021), Disp: , Rfl:    ALPRAZolam (XANAX) 0.5 MG tablet, 1-2 tablets daily for severe anxiety or before flying., Disp: 10 tablet, Rfl: 0   ascorbic acid (VITAMIN C) 500 MG tablet, Take 500-1,000 mg by mouth daily. (Patient not taking: Reported on 05/02/2021), Disp: , Rfl:    aspirin EC 325 MG  tablet, Take 325 mg by mouth every other day. (Patient not taking: Reported on 05/02/2021), Disp: , Rfl:    calcium carbonate (TUMS - DOSED IN MG ELEMENTAL CALCIUM) 500 MG chewable tablet, Chew 1-2 tablets by mouth as needed for indigestion or heartburn.  (Patient not taking: Reported on 05/02/2021), Disp: , Rfl:    cariprazine (VRAYLAR) 1.5 MG capsule, TAKE 1 CAPSULE BY MOUTH DAILY., Disp: 30 capsule, Rfl: 2   cariprazine (VRAYLAR) 3 MG capsule, Take 1 capsule (3 mg total) by mouth daily., Disp: 30 capsule, Rfl: 3   COENZYME Q10 PO, Take 30 mg by mouth 3 (three) times daily. (Patient not taking: Reported on 05/02/2021), Disp: , Rfl:    multivitamin (ONE-A-DAY MEN'S) TABS tablet, Take 1 tablet by mouth daily. (Patient not taking: Reported on 05/02/2021), Disp: , Rfl:    NON FORMULARY, Take 2 capsules by mouth See admin instructions. Sea Moss capsules; Take 2 capsules by mouth three times a week (Patient not taking: Reported on 05/02/2021), Disp: , Rfl:    omeprazole (PRILOSEC) 40 MG capsule, Take 40 mg by mouth daily as needed (for reflux).  (Patient not taking: Reported on 05/02/2021), Disp: , Rfl:    valACYclovir (VALTREX) 500 MG tablet, Take 500 mg by mouth daily. (Patient not taking: Reported on 07/12/2021), Disp: , Rfl:   Current Facility-Administered Medications:    0.9 %  sodium chloride infusion, 500 mL, Intravenous, Continuous, Hilarie Fredrickson, MD Medication Side Effects: none  Family Medical/ Social History: Changes? No  MENTAL  HEALTH EXAM:  There were no vitals taken for this visit.There is no height or weight on file to calculate BMI.  General Appearance: Casual and Well Groomed  Eye Contact:  Good  Speech:  Clear and Coherent  Volume:  Normal  Mood:  NA  Affect:  Appropriate  Thought Process:  Coherent  Orientation:  Full (Time, Place, and Person)  Thought Content: Logical   Suicidal Thoughts:  No  Homicidal Thoughts:  No  Memory:  WNL  Judgement:  Good  Insight:  Good   Psychomotor Activity:  Normal  Concentration:  Concentration: Good  Recall:  Good  Fund of Knowledge: Good  Language: Good  Assets:  Desire for Improvement  ADL's:  Intact  Cognition: WNL  Prognosis:  Good    DIAGNOSES:    ICD-10-CM   1. Bipolar I disorder (HCC)  F31.9     2. Generalized anxiety disorder  F41.1       Receiving Psychotherapy: No    RECOMMENDATIONS:   Greater than 50% of 30  face to face time with patient was spent on counseling and coordination of care. No changes this visit. He says that he is ready but wants to stop taking his medication and would like guidance on how to wean. We had a very candid discussion that the psychosis could have originally been triggered by substances, but I explained that I cannot guarantee that he will not experience another episode in the future. Some people can relapse after many years. He acknowledges understanding these risk and would like to wean off Vraylar slowly over the next month.  We discussed his desire to reduce Vraylar to 1.5 mg. Pt would like to wean off the medication because he feel that he has been stable for over one year. To discuss further next visit.    Agreed to: Continue Vraylar to 1.5 mg every other day for two weeks, then one capsule every 3 days for two weeks before stopping.  He will report unusual behaviors or return of other symptoms immediately.  Will report side effects or worsening symptoms promptly To follow up in 8 weeks to reassess Reviewed recent labs Provided emergency contact information Discussed potential metabolic side effects associated with atypical antipsychotics, as well as potential risk for movement side effects. Advised pt to contact office if movement side effects occur.   Reviewed labs from PCP  Reviewed PDMP    Joan Flores, NP

## 2023-01-31 ENCOUNTER — Ambulatory Visit: Payer: BC Managed Care – PPO | Admitting: Physician Assistant

## 2023-01-31 ENCOUNTER — Telehealth: Payer: Self-pay | Admitting: Behavioral Health

## 2023-01-31 MED ORDER — CARIPRAZINE HCL 3 MG PO CAPS: 3.0000 mg | ORAL_CAPSULE | Freq: Every day | ORAL | 0 refills | Status: DC

## 2023-01-31 MED ORDER — QUETIAPINE FUMARATE 50 MG PO TABS: 50.0000 mg | ORAL_TABLET | Freq: Every day | ORAL | 0 refills | Status: DC

## 2023-01-31 NOTE — Telephone Encounter (Signed)
Please see message from patient. He has been off Vraylar since 7/7. He reports the sleeping issue and racing thoughts has only been for the past few days. The sleep issue is periodic. He gave verbal permission to talk to his parents. Told him he needed to fill out a DPR at next visit. Mom is in the background and I couldn't hear what she said but patient responded that he was not sad and that she could not tell him how he felt in his body. Affect was flat. He voiced no other complaints.

## 2023-01-31 NOTE — Telephone Encounter (Signed)
This could be precursor to relapse for mania and we do not need to take any changes. Second occurrences are more difficult to treat. He needs to restart Vraylar 1.5 mg immediately.  He should take 1.5 mg for  5 days, then take 3 mg daily .Have come by and get samples.  Please pend 3 mg .  Please also pend, Seroquel 50 mg at bedtime for sleep. He needs to take 1/2 hour before bedtime. Good sleep hygiene.  Have him schedule f/u in 4 weeks. Thank you.

## 2023-01-31 NOTE — Telephone Encounter (Signed)
Pt called and said that he is having trouble sleeping and his mind is racing. ?Please call Areg at [phone]

## 2023-02-05 ENCOUNTER — Ambulatory Visit: Payer: BC Managed Care – PPO | Admitting: Psychiatry

## 2023-02-05 NOTE — Telephone Encounter (Signed)
Pt is scheduled 8/7

## 2023-02-07 ENCOUNTER — Ambulatory Visit (INDEPENDENT_AMBULATORY_CARE_PROVIDER_SITE_OTHER): Payer: BC Managed Care – PPO | Admitting: Behavioral Health

## 2023-02-07 ENCOUNTER — Encounter: Payer: Self-pay | Admitting: Behavioral Health

## 2023-02-07 DIAGNOSIS — F319 Bipolar disorder, unspecified: Secondary | ICD-10-CM | POA: Diagnosis not present

## 2023-02-07 DIAGNOSIS — F22 Delusional disorders: Secondary | ICD-10-CM

## 2023-02-07 DIAGNOSIS — F411 Generalized anxiety disorder: Secondary | ICD-10-CM | POA: Diagnosis not present

## 2023-02-07 NOTE — Progress Notes (Signed)
[name]  Patient ID: Chase Mcneil,  MRN: [id number]  PCP: Creola Corn, MD  Date of Evaluation: 02/07/2023 Time spent:30 minutes  Chief Complaint:  Chief Complaint   Manic Behavior; Follow-up; Fatigue; Medication Refill; Patient Education; Establish Care; Altered Mental Status     HISTORY/CURRENT STATUS: HPI 27 year old male presents to this office for follow up. His mother and father present with his consent. Says that he started experiencing  insomnia and was not getting sleep as well as confusion. He was also experiencing some parnoia.   His anxiety and depression levels increased. He knew something was wrong so called in to the office to report.  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 He is denying any illicit substances. Says he stopped cannabis two months ago.  He says his anxiety is 6/10 and depression is 4/10 at this time.He is sleeping about 8 hours per day now with aid of Seroquel.  He denies any more psychosis, or mania. No auditory or visual hallucinations. He denies SI/HI.   Previous psychiatric medication trials:   Abilify- says that he had experienced increased odd behavior according to mother and they stopped administration. Occurred in [place]? Olanzapine-Excessive weight gain. Pt does not want to continue  Lybalvi-Excessive lethargy, daytime sleepiness and fatigue         Individual Medical History/ Review of Systems: Changes? :No   Allergies: Patient has no known allergies.  Current Medications:  Current Outpatient Medications:    albuterol (VENTOLIN HFA) 108 (90 Base) MCG/ACT inhaler, Inhale 2 puffs into the lungs See admin instructions. Inhale 2 puffs into the lungs every 4-6 hours as needed for shortness of breath or wheezing (Patient not taking: Reported on 05/02/2021), Disp: , Rfl:    ALPRAZolam (XANAX) 0.5 MG tablet, 1-2 tablets daily for severe anxiety or before flying., Disp: 10 tablet, Rfl: 0   ascorbic acid (VITAMIN C)  500 MG tablet, Take 500-1,000 mg by mouth daily. (Patient not taking: Reported on 05/02/2021), Disp: , Rfl:    aspirin EC 325 MG tablet, Take 325 mg by mouth every other day. (Patient not taking: Reported on 05/02/2021), Disp: , Rfl:    calcium carbonate (TUMS - DOSED IN MG ELEMENTAL CALCIUM) 500 MG chewable tablet, Chew 1-2 tablets by mouth as needed for indigestion or heartburn.  (Patient not taking: Reported on 05/02/2021), Disp: , Rfl:    cariprazine (VRAYLAR) 1.5 MG capsule, TAKE 1 CAPSULE BY MOUTH DAILY., Disp: 30 capsule, Rfl: 2   cariprazine (VRAYLAR) 3 MG capsule, Take 1 capsule (3 mg total) by mouth daily., Disp: 30 capsule, Rfl: 3   cariprazine (VRAYLAR) 3 MG capsule, Take 1 capsule (3 mg total) by mouth daily., Disp: 30 capsule, Rfl: 0   COENZYME Q10 PO, Take 30 mg by mouth 3 (three) times daily. (Patient not taking: Reported on 05/02/2021), Disp: , Rfl:    multivitamin (ONE-A-DAY MEN'S) TABS tablet, Take 1 tablet by mouth daily. (Patient not taking: Reported on 05/02/2021), Disp: , Rfl:    NON FORMULARY, Take 2 capsules by mouth See admin instructions. Sea Moss capsules; Take 2 capsules by mouth three times a week (Patient not taking: Reported on 05/02/2021), Disp: , Rfl:    omeprazole (PRILOSEC) 40 MG capsule, Take 40 mg by mouth daily as needed (for reflux).  (Patient not taking: Reported on 05/02/2021), Disp: , Rfl:    QUEtiapine (SEROQUEL) 50 MG tablet, Take 1 tablet (50 mg total) by mouth at bedtime., Disp: 30 tablet,  Rfl: 0   valACYclovir (VALTREX) 500 MG tablet, Take 500 mg by mouth daily. (Patient not taking: Reported on 07/12/2021), Disp: , Rfl:   Current Facility-Administered Medications:    0.9 %  sodium chloride infusion, 500 mL, Intravenous, Continuous, Hilarie Fredrickson, MD Medication Side Effects: none  Family Medical/ Social History: Changes? No  MENTAL HEALTH EXAM:  There were no vitals taken for this visit.There is no height or weight on file to calculate BMI.  General  Appearance: Casual, Neat, and Well Groomed  Eye Contact:  Good  Speech:  Clear and Coherent  Volume:  Normal  Mood:  Anxious and Dysphoric  Affect:  Depressed, Flat, and Anxious  Thought Process:  Coherent  Orientation:  Full (Time, Place, and Person)  Thought Content: Logical   Suicidal Thoughts:  No  Homicidal Thoughts:  No  Memory:  WNL  Judgement:  Good  Insight:  Good  Psychomotor Activity:  Normal  Concentration:  Concentration: Good  Recall:  Good  Fund of Knowledge: Good  Language: Good  Assets:  Desire for Improvement  ADL's:  Intact  Cognition: WNL  Prognosis:  Good    DIAGNOSES:    ICD-10-CM   1. Bipolar I disorder (HCC)  F31.9     2. Paranoia (HCC)  F22     3. Generalized anxiety disorder  F41.1       Receiving Psychotherapy: No    RECOMMENDATIONS:   Greater than 50% of 30  face to face time with patient was spent on counseling and coordination of care. We talked about his recent return of symptoms similar to his initial episode with psychosis approximately two years ago.  Patient started noticing symptoms less than one month after stopping VraylarAfter pt called in with concern of no sleep, confusion, depression, and anxiety,  we agreed that we should immediately restart Leafy Kindle out of concern of relapse.  Encouraged him to get out the house in the am after breakfast and utilize his gym membership. Coached him on good sleep hygiene and sleep wake cycle. Reviewed his medications  with him.    Agreed to:  Restart  Vraylar to 1.5 mg for one week, then 3 mg daily.  He will report unusual behaviors or return of other symptoms immediately.  Will restart Seroquel 50 mg at bedtime. May take 25 mg if too sedated or sleepy next day.  Will report side effects or worsening symptoms promptly To follow up in 6  weeks to reassess Reviewed recent labs Provided emergency contact information Discussed potential metabolic side effects associated with atypical  antipsychotics, as well as potential risk for movement side effects. Advised pt to contact office if movement side effects occur.   Reviewed labs from PCP  Reviewed PDMP          Joan Flores, NP

## 2023-02-22 ENCOUNTER — Other Ambulatory Visit: Payer: Self-pay | Admitting: Behavioral Health

## 2023-03-01 ENCOUNTER — Encounter: Payer: Self-pay | Admitting: Internal Medicine

## 2023-03-01 ENCOUNTER — Ambulatory Visit (INDEPENDENT_AMBULATORY_CARE_PROVIDER_SITE_OTHER): Payer: BC Managed Care – PPO | Admitting: Internal Medicine

## 2023-03-01 VITALS — BP 120/80 | HR 102 | Ht 72.0 in | Wt 197.0 lb

## 2023-03-01 DIAGNOSIS — R197 Diarrhea, unspecified: Secondary | ICD-10-CM

## 2023-03-01 MED ORDER — METRONIDAZOLE 250 MG PO TABS: 250.0000 mg | ORAL_TABLET | Freq: Three times a day (TID) | ORAL | 0 refills | Status: DC

## 2023-03-01 NOTE — Patient Instructions (Signed)
We have sent the following medications to your pharmacy for you to pick up at your convenience:  Metronidazole  Take 2 tablespoons of Citrucel in water or juice daily.  _______________________________________________________  If your blood pressure at your visit was 140/90 or greater, please contact your primary care physician to follow up on this.  _______________________________________________________  If you are age 29 or older, your body mass index should be between 23-30. Your Body mass index is 26.72 kg/m. If this is out of the aforementioned range listed, please consider follow up with your Primary Care Provider.  If you are age 30 or younger, your body mass index should be between 19-25. Your Body mass index is 26.72 kg/m. If this is out of the aformentioned range listed, please consider follow up with your Primary Care Provider.   ________________________________________________________  The [place] [facility] providers would like to encourage you to use [facility] to communicate with providers for non-urgent requests or questions.  Due to long hold times on the telephone, sending your provider a message by [facility] may be a faster and more efficient way to get a response.  Please allow 48 business hours for a response.  Please remember that this is for non-urgent requests.  _______________________________________________________

## 2023-03-01 NOTE — Progress Notes (Signed)
HISTORY OF PRESENT ILLNESS:  Chase Mcneil is a 27 y.o. male with a history of bipolar disorder, generalized anxiety, and GERD.  He presents today at the request of his PCP regarding chronic diarrhea.  He is accompanied by his mother.  The patient was seen in this office on 1 occasion March 2018, by the GI physician assistant.  Issues at that time were hematochezia, abdominal pain, and chronic diarrhea.  See that dictation.  He subsequently underwent complete colonoscopy October 09, 2016.  The entire colon was normal.  The ileum was normal.  Random colon biopsies were normal.  It was recommended that he initiate daily fiber supplementation and follow-up with me in the office in 6 to 8 weeks.  He did not.  Has not been seen since.  Patient states that his problems with diarrhea have been worse over the past year after returning from [place].  He estimates that approximately 60% of his stools are loose.  Typically has 1 bowel movement per day.  Can occasionally have issues with urgency and loose stools later in the day or occasionally early in the morning, say 5 AM.  There is at times of postprandial component.  No bleeding.  No weight loss.  No abdominal pain.  He did start Vraylar about a year ago.  Does take omeprazole for GERD, which helps.  No other GI issues reported.  Abdominal films from 2021 were negative.  REVIEW OF SYSTEMS:  All non-GI ROS negative except for  Past Medical History:  Diagnosis Date   GERD (gastroesophageal reflux disease)    Rosacea keratitis     Past Surgical History:  Procedure Laterality Date   ESOPHAGOGASTRODUODENOSCOPY N/A 12/29/2019   Procedure: ESOPHAGOGASTRODUODENOSCOPY (EGD);  Surgeon: Diamantina Monks, MD;  Location: [facility] ENDOSCOPY;  Service: General;  Laterality: N/A;   TONSILLECTOMY AND ADENOIDECTOMY      Social History Chase Mcneil  reports that he has quit smoking. His smoking use included cigarettes. He has never used smokeless tobacco. He reports current  alcohol use. He reports current drug use. Drug: Marijuana.  family history includes Anxiety disorder in his cousin; Depression in his cousin; Liver cancer in his father; Suicidality in his cousin.  No Known Allergies     PHYSICAL EXAMINATION: Vital signs: BP 120/80   Pulse (!) 102   Ht 6' (1.829 m)   Wt 197 lb (89.4 kg)   BMI 26.72 kg/m   Constitutional: generally well-appearing, no acute distress Psychiatric: alert and oriented x3, cooperative Eyes: extraocular movements intact, anicteric, conjunctiva pink Mouth: oral pharynx moist, no lesions Neck: supple no lymphadenopathy Cardiovascular: heart regular rate and rhythm, no murmur Lungs: clear to auscultation bilaterally Abdomen: soft, nontender, nondistended, no obvious ascites, no peritoneal signs, normal bowel sounds, no organomegaly Rectal: Omitted Extremities: no clubbing, cyanosis, or lower extremity edema bilaterally Skin: no lesions on visible extremities Neuro: No focal deficits.  Cranial nerves intact  ASSESSMENT:  1.  Chronic diarrhea.  Rule out bacterial overgrowth.  Rule out IBS.  Rule out bile salt related. 2.  Multiple significant behavioral health issues 3.  Normal colonoscopy with biopsies and ileoscopy 2018   PLAN:  1.  Empiric course of metronidazole 250 mg p.o. 3 times daily x 2 weeks 2.  Initiate fiber in the form of Citrucel 2 tablespoons daily 3.  Office follow-up in 8 to 10 weeks.  Can consider other therapies at that time if he has not responded.  Specifically colestipol or low-dose antidiarrheals. A total time  of 45 minutes was spent preparing to see the patient, obtaining comprehensive history, performing medically appropriate physical examination, counseling and educating the patient and his mother regarding the above listed issues, ordering medication, arranging follow-up, and documenting clinical information in the health record

## 2023-03-08 ENCOUNTER — Telehealth: Payer: Self-pay | Admitting: Behavioral Health

## 2023-03-08 DIAGNOSIS — F411 Generalized anxiety disorder: Secondary | ICD-10-CM

## 2023-03-08 DIAGNOSIS — F319 Bipolar disorder, unspecified: Secondary | ICD-10-CM

## 2023-03-08 MED ORDER — CARIPRAZINE HCL 3 MG PO CAPS: 3.0000 mg | ORAL_CAPSULE | Freq: Every day | ORAL | 0 refills | Status: DC

## 2023-03-08 NOTE — Telephone Encounter (Signed)
Appt 03/16/23, Please send RF on Vraylar 3.0mg  [facility] to [facility] : Will need some before appt. They use a copay card. [id number] - [place], [place] - [address]

## 2023-03-08 NOTE — Telephone Encounter (Signed)
Sent!

## 2023-03-16 ENCOUNTER — Ambulatory Visit (INDEPENDENT_AMBULATORY_CARE_PROVIDER_SITE_OTHER): Payer: BC Managed Care – PPO | Admitting: Behavioral Health

## 2023-03-16 ENCOUNTER — Encounter: Payer: Self-pay | Admitting: Behavioral Health

## 2023-03-16 DIAGNOSIS — F411 Generalized anxiety disorder: Secondary | ICD-10-CM | POA: Diagnosis not present

## 2023-03-16 DIAGNOSIS — F319 Bipolar disorder, unspecified: Secondary | ICD-10-CM | POA: Diagnosis not present

## 2023-03-16 MED ORDER — QUETIAPINE FUMARATE 50 MG PO TABS: 50.0000 mg | ORAL_TABLET | Freq: Every day | ORAL | 1 refills | Status: DC

## 2023-03-16 MED ORDER — CARIPRAZINE HCL 3 MG PO CAPS: 3.0000 mg | ORAL_CAPSULE | Freq: Every day | ORAL | 3 refills | Status: DC

## 2023-03-16 NOTE — Progress Notes (Signed)
[name]  Patient ID: Chase Mcneil,  MRN: [id number]  PCP: Creola Corn, MD  Date of Evaluation: 03/16/2023 Time spent:40 minutes  Chief Complaint:  Chief Complaint   Depression; Anxiety; Manic Behavior; Follow-up; Patient Education; Family Problem; Medication Refill     HISTORY/CURRENT STATUS: HPI 27 year old male presents to this office for follow up. His mother and father present with his consent. Doing much better. No psychosis since last visit.  He is sleeping well with Seroquel.  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 He is denying any illicit substances. Not using cannabis right now. He says his anxiety is 4/10 and depression is 3/10 at this time.He is sleeping about 8 hours per day now with aid of Seroquel.  He denies any more psychosis, or mania. No auditory or visual hallucinations. He denies SI/HI.   Previous psychiatric medication trials:   Abilify- says that he had experienced increased odd behavior according to mother and they stopped administration. Occurred in [place]? Olanzapine-Excessive weight gain. Pt does not want to continue  Lybalvi-Excessive lethargy, daytime sleepiness and fatigue     Individual Medical History/ Review of Systems: Changes? :No   Allergies: Patient has no known allergies.  Current Medications:  Current Outpatient Medications:    albuterol (VENTOLIN HFA) 108 (90 Base) MCG/ACT inhaler, Inhale 2 puffs into the lungs See admin instructions. Inhale 2 puffs into the lungs every 4-6 hours as needed for shortness of breath or wheezing, Disp: , Rfl:    ALPRAZolam (XANAX) 0.5 MG tablet, 1-2 tablets daily for severe anxiety or before flying., Disp: 10 tablet, Rfl: 0   ascorbic acid (VITAMIN C) 500 MG tablet, Take 500-1,000 mg by mouth daily., Disp: , Rfl:    aspirin EC 325 MG tablet, Take 325 mg by mouth every other day., Disp: , Rfl:    calcium carbonate (TUMS - DOSED IN MG ELEMENTAL CALCIUM) 500 MG chewable tablet,  Chew 1-2 tablets by mouth as needed for indigestion or heartburn., Disp: , Rfl:    cariprazine (VRAYLAR) 3 MG capsule, Take 1 capsule (3 mg total) by mouth daily., Disp: 30 capsule, Rfl: 0   cariprazine (VRAYLAR) 3 MG capsule, Take 1 capsule (3 mg total) by mouth daily., Disp: 30 capsule, Rfl: 3   COENZYME Q10 PO, Take 30 mg by mouth 3 (three) times daily., Disp: , Rfl:    metroNIDAZOLE (FLAGYL) 250 MG tablet, Take 1 tablet (250 mg total) by mouth 3 (three) times daily., Disp: 42 tablet, Rfl: 0   multivitamin (ONE-A-DAY MEN'S) TABS tablet, Take 1 tablet by mouth daily., Disp: , Rfl:    NON FORMULARY, Take 2 capsules by mouth See admin instructions. Sea Moss capsules; Take 2 capsules by mouth three times a week, Disp: , Rfl:    omeprazole (PRILOSEC) 40 MG capsule, Take 40 mg by mouth daily as needed (for reflux)., Disp: , Rfl:    QUEtiapine (SEROQUEL) 50 MG tablet, Take 1 tablet (50 mg total) by mouth at bedtime., Disp: 90 tablet, Rfl: 1   valACYclovir (VALTREX) 500 MG tablet, Take 500 mg by mouth daily., Disp: , Rfl:   Current Facility-Administered Medications:    0.9 %  sodium chloride infusion, 500 mL, Intravenous, Continuous, Hilarie Fredrickson, MD Medication Side Effects: none  Family Medical/ Social History: Changes? No  MENTAL HEALTH EXAM:  There were no vitals taken for this visit.There is no height or weight on file to calculate BMI.  General Appearance: Casual, Neat, and Well  Groomed  Eye Contact:  Good  Speech:  Clear and Coherent  Volume:  Normal  Mood:  NA  Affect:  Appropriate  Thought Process:  Coherent  Orientation:  Full (Time, Place, and Person)  Thought Content: Logical   Suicidal Thoughts:  No  Homicidal Thoughts:  No  Memory:  WNL  Judgement:  Good  Insight:  Good  Psychomotor Activity:  Normal  Concentration:  Concentration: Good  Recall:  Good  Fund of Knowledge: Good  Language: Good  Assets:  Desire for Improvement  ADL's:  Intact  Cognition: WNL   Prognosis:  Good    DIAGNOSES:    ICD-10-CM   1. Bipolar I disorder (HCC)  F31.9 cariprazine (VRAYLAR) 3 MG capsule    QUEtiapine (SEROQUEL) 50 MG tablet    2. Generalized anxiety disorder  F41.1 cariprazine (VRAYLAR) 3 MG capsule      Receiving Psychotherapy: No    RECOMMENDATIONS:   Greater than 50% of 30  face to face time with patient was spent on counseling and coordination of care. Vraylar has improved his moods. No psychosis, paranoia, or hallucinations. Encouraged him to get out the house in the am after breakfast and utilize his gym membership. Coached him on good sleep hygiene and sleep wake cycle. Reviewed his medications  with him.    Agreed to:   Continue Vraylar 3 mg daily.   He will report unusual behaviors or return of other symptoms immediately.  Will restart Seroquel 50 mg at bedtime. May take 25 mg if too sedated or sleepy next day.  Will report side effects or worsening symptoms promptly To follow up in 8 weeks to reassess Reviewed recent labs Provided emergency contact information Discussed potential metabolic side effects associated with atypical antipsychotics, as well as potential risk for movement side effects. Advised pt to contact office if movement side effects occur.   Reviewed labs from PCP  Reviewed PDMP           Joan Flores, NP

## 2023-05-03 ENCOUNTER — Telehealth: Payer: Self-pay | Admitting: Internal Medicine

## 2023-05-03 ENCOUNTER — Other Ambulatory Visit: Payer: Self-pay

## 2023-05-03 MED ORDER — METRONIDAZOLE 250 MG PO TABS: 250.0000 mg | ORAL_TABLET | Freq: Three times a day (TID) | ORAL | 1 refills | Status: DC

## 2023-05-03 NOTE — Telephone Encounter (Signed)
Please prescribe 1 more round of metronidazole, as previous.  Give 1 refill as well. Thanks

## 2023-05-03 NOTE — Telephone Encounter (Signed)
Patients mother called to get some advise on possible getting more medication for her son due to him having diarrhea. Please advise

## 2023-05-03 NOTE — Telephone Encounter (Signed)
Prescription sent to pharmacy an pts father aware.

## 2023-05-03 NOTE — Telephone Encounter (Signed)
Pts mother states his diarrhea got better after taking flagyl. States since finishing the flagyl the diarrhea has slowly started coming back. She is calling to see if you think he could have another round of antibiotics or if he needs something else. He has started working and would like to have something to help with the diarrhea. Please advise.

## 2023-05-10 NOTE — Telephone Encounter (Signed)
Inbound call from patient's mother, states medication prescribed is not working, patient is still having Diarrhea, would like to discuss other medications.

## 2023-05-10 NOTE — Telephone Encounter (Signed)
Attempted to return call but received message that voice mailbox is full and cannot accept messages at this time. Will try again.

## 2023-05-11 NOTE — Telephone Encounter (Signed)
Pts mother states the flagyl did not help him this time. Discussed with her that he can try Imodium OTC as the box states until his upcoming f/u appt with Dr. Marina Goodell. She verbalized understanding.

## 2023-05-22 ENCOUNTER — Ambulatory Visit (INDEPENDENT_AMBULATORY_CARE_PROVIDER_SITE_OTHER): Payer: BC Managed Care – PPO | Admitting: Internal Medicine

## 2023-05-22 ENCOUNTER — Encounter: Payer: Self-pay | Admitting: Internal Medicine

## 2023-05-22 VITALS — BP 118/80 | HR 92 | Ht 70.5 in | Wt 201.4 lb

## 2023-05-22 DIAGNOSIS — K582 Mixed irritable bowel syndrome: Secondary | ICD-10-CM | POA: Diagnosis not present

## 2023-05-22 NOTE — Patient Instructions (Addendum)
Take 2 tablespoons of Citrucel daily.  Please follow up in 6 months  _______________________________________________________  If your blood pressure at your visit was 140/90 or greater, please contact your primary care physician to follow up on this.  _______________________________________________________  If you are age 29 or older, your body mass index should be between 23-30. Your Body mass index is 28.49 kg/m. If this is out of the aforementioned range listed, please consider follow up with your Primary Care Provider.  If you are age 52 or younger, your body mass index should be between 19-25. Your Body mass index is 28.49 kg/m. If this is out of the aformentioned range listed, please consider follow up with your Primary Care Provider.   ________________________________________________________  The [place] [facility] providers would like to encourage you to use [facility] to communicate with providers for non-urgent requests or questions.  Due to long hold times on the telephone, sending your provider a message by [facility] may be a faster and more efficient way to get a response.  Please allow 48 business hours for a response.  Please remember that this is for non-urgent requests.  _______________________________________________________

## 2023-05-22 NOTE — Progress Notes (Signed)
HISTORY OF PRESENT ILLNESS:  Chase Mcneil is a 27 y.o. male, with bipolar disorder, generalized anxiety, and GERD.  He was evaluated in the office March 01, 2023 for follow-up regarding chronic diarrhea.  Prior evaluation in 2018 for the same revealed normal colonoscopy with biopsies and ileoscopy.  At that time he was felt to have bacterial overgrowth versus IBS versus bile salt related diarrhea.  He was treated with a course of metronidazole and Citrucel.  He states that his bowels improved.  He took a second course of metronidazole which did not seem to help.  He discontinued Citrucel.  Presents today for follow-up.  He is accompanied by his mother.  Tells me that most days he will have loose stools.  However, he did take a dose of Imodium and did not have a bowel movement for 2 days.  Otherwise doing well and overall feels like he is doing better.  No new complaints.  REVIEW OF SYSTEMS:  All non-GI ROS negative.  Past Medical History:  Diagnosis Date   GERD (gastroesophageal reflux disease)    Rosacea keratitis     Past Surgical History:  Procedure Laterality Date   ESOPHAGOGASTRODUODENOSCOPY N/A 12/29/2019   Procedure: ESOPHAGOGASTRODUODENOSCOPY (EGD);  Surgeon: Diamantina Monks, MD;  Location: [facility] ENDOSCOPY;  Service: General;  Laterality: N/A;   TONSILLECTOMY AND ADENOIDECTOMY      Social History EVA MOODIE  reports that he has quit smoking. His smoking use included cigarettes. He has never used smokeless tobacco. He reports current alcohol use. He reports current drug use. Drug: Marijuana.  family history includes Anxiety disorder in his cousin; Depression in his cousin; Liver cancer in his father; Suicidality in his cousin.  No Known Allergies     PHYSICAL EXAMINATION: Vital signs: BP 118/80 (BP Location: Left Arm, Patient Position: Sitting, Cuff Size: Normal)   Pulse 92   Ht 5' 10.5" (1.791 m) Comment: height measured without shoes  Wt 201 lb 6 oz (91.3 kg)   BMI  28.49 kg/m   Constitutional: generally well-appearing, no acute distress Psychiatric: alert and oriented x3, cooperative Eyes: extraocular movements intact, anicteric, conjunctiva pink Mouth: oral pharynx moist, no lesions Neck: supple no lymphadenopathy Cardiovascular: heart regular rate and rhythm, no murmur Lungs: clear to auscultation bilaterally Abdomen: soft, nontender, nondistended, no obvious ascites, no peritoneal signs, normal bowel sounds, no organomegaly Rectal: Omitted Extremities: no clubbing, cyanosis, or lower extremity edema bilaterally Skin: no lesions on visible extremities Neuro: No focal deficits.  Cranial nerves intact    ASSESSMENT:  1.  Diarrhea predominant IBS.  Transient response to metronidazole and fiber. 2.  Normal colonoscopy with biopsies and ileoscopy April 2018   PLAN:  1.  Recommend reinitiating Citrucel 2 tablespoons daily. 2.  Okay to take low-dose Imodium as needed 3.  Routine GI follow-up 6 months.

## 2023-05-23 ENCOUNTER — Ambulatory Visit: Payer: BC Managed Care – PPO | Admitting: Behavioral Health

## 2023-05-24 ENCOUNTER — Encounter: Payer: Self-pay | Admitting: Behavioral Health

## 2023-05-24 ENCOUNTER — Ambulatory Visit: Payer: BC Managed Care – PPO | Admitting: Behavioral Health

## 2023-05-24 DIAGNOSIS — F411 Generalized anxiety disorder: Secondary | ICD-10-CM | POA: Diagnosis not present

## 2023-05-24 DIAGNOSIS — F319 Bipolar disorder, unspecified: Secondary | ICD-10-CM | POA: Diagnosis not present

## 2023-05-24 MED ORDER — CARIPRAZINE HCL 3 MG PO CAPS: 3.0000 mg | ORAL_CAPSULE | Freq: Every day | ORAL | 3 refills | Status: DC

## 2023-05-24 NOTE — Progress Notes (Signed)
[name]  Patient ID: Chase Mcneil,  MRN: [id number]  PCP: Creola Corn, MD  Date of Evaluation: 05/24/2023 Time spent:30 minutes  Chief Complaint:  Chief Complaint   Anxiety; Depression; Follow-up; Patient Education; Medication Refill; Family Problem     HISTORY/CURRENT STATUS: HPI 27 year old male presents to this office for follow up. His mother and father present with his consent. Doing much better. No psychosis since last visit.  He is sleeping well with Seroquel.  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 He is denying any illicit substances. Not using cannabis right now. He says his anxiety is 4/10 and depression is 3/10 at this time.He is sleeping about 8 hours per day now with aid of Seroquel.  He denies any more psychosis, or mania. No auditory or visual hallucinations. He denies SI/HI.   Previous psychiatric medication trials:   Abilify- says that he had experienced increased odd behavior according to mother and they stopped administration. Occurred in [place]? Olanzapine-Excessive weight gain. Pt does not want to continue  Lybalvi-Excessive lethargy, daytime sleepiness and fatigue   Individual Medical History/ Review of Systems: Changes? :No   Allergies: Patient has no known allergies.  Current Medications:  Current Outpatient Medications:    ALPRAZolam (XANAX) 0.5 MG tablet, 1-2 tablets daily for severe anxiety or before flying. (Patient not taking: Reported on 05/22/2023), Disp: 10 tablet, Rfl: 0   cariprazine (VRAYLAR) 3 MG capsule, Take 1 capsule (3 mg total) by mouth daily., Disp: 30 capsule, Rfl: 3   multivitamin (ONE-A-DAY MEN'S) TABS tablet, Take 1 tablet by mouth daily., Disp: , Rfl:   Current Facility-Administered Medications:    0.9 %  sodium chloride infusion, 500 mL, Intravenous, Continuous, Hilarie Fredrickson, MD Medication Side Effects: none  Family Medical/ Social History: Changes? No  MENTAL HEALTH EXAM:  There were no  vitals taken for this visit.There is no height or weight on file to calculate BMI.  General Appearance: Casual, Neat, and Well Groomed  Eye Contact:  Good  Speech:  Clear and Coherent  Volume:  Normal  Mood:  NA  Affect:  Appropriate  Thought Process:  Coherent  Orientation:  Full (Time, Place, and Person)  Thought Content: NA   Suicidal Thoughts:  No  Homicidal Thoughts:  No  Memory:  WNL  Judgement:  Good  Insight:  Good  Psychomotor Activity:  Normal  Concentration:  Concentration: Good  Recall:  Good  Fund of Knowledge: Good  Language: Good  Assets:  Desire for Improvement  ADL's:  Intact  Cognition: WNL  Prognosis:  Good    DIAGNOSES:    ICD-10-CM   1. Bipolar I disorder (HCC)  F31.9 cariprazine (VRAYLAR) 3 MG capsule    2. Generalized anxiety disorder  F41.1 cariprazine (VRAYLAR) 3 MG capsule      Receiving Psychotherapy: No    RECOMMENDATIONS:  Greater than 50% of 30  face to face time with patient was spent on counseling and coordination of care. Vraylar has improved his moods. No psychosis, paranoia, or hallucinations. Encouraged him to get out the house in the am after breakfast and utilize his gym membership. Coached him on good sleep hygiene and sleep wake cycle. Reviewed his medications  with him.    Agreed to:   Continue Vraylar 3 mg daily.   He will report unusual behaviors or return of other symptoms immediately.  Pt wanted to stop Seroquel for sleep Will report side effects or worsening symptoms promptly To  follow up in 12 weeks to reassess Reviewed recent labs Provided emergency contact information Discussed potential metabolic side effects associated with atypical antipsychotics, as well as potential risk for movement side effects. Advised pt to contact office if movement side effects occur.   Reviewed labs from PCP  Reviewed PDMP          Joan Flores, NP

## 2023-08-01 ENCOUNTER — Telehealth: Payer: Self-pay | Admitting: Behavioral Health

## 2023-08-01 NOTE — Telephone Encounter (Signed)
Please see message. Dad is not on DPR, but he had a lot of questions related to this message. I don't know why this is needed and not sure if that is something you have done or can do. I called the # and they said no PA was needed.

## 2023-08-01 NOTE — Telephone Encounter (Signed)
Chase Mcneil

## 2023-08-01 NOTE — Telephone Encounter (Signed)
Pt's dad, Molli Hazard, called at 11:08a.  He is NOT on the DPR for this office.  He is asking for a call back because he said he switched from [facility] to [facility] and they are saying they are wanting Arlys John to call them at [phone] for an authorization for a "full psych evaluation".  I didn't understand what he is asking for since he's already a pt here and as I said he's not on the DPR. So I am asking someone to call him back to at [phone]

## 2023-08-01 NOTE — Telephone Encounter (Signed)
If now PA needed, then he should have no problems getting the psych eval. If they have been told this then we just need to wait.

## 2023-08-02 NOTE — Telephone Encounter (Signed)
Called patient and per DPR LVM to [facility] to discuss this. I am not sure what is needed.   Authorized to leave detailed message on my cell phone voice mail at [phone].

## 2023-08-03 ENCOUNTER — Telehealth: Payer: Self-pay | Admitting: Behavioral Health

## 2023-08-03 NOTE — Telephone Encounter (Signed)
Dad called and faxed over the new insurance card [facility]. It has been scanned in his chart and he needs a PA for his vraylar

## 2023-08-03 NOTE — Telephone Encounter (Signed)
 Noted thank you

## 2023-08-08 NOTE — Telephone Encounter (Signed)
Pharmacy states he picked up Vraylar on 2/1

## 2023-08-08 NOTE — Telephone Encounter (Signed)
 Tried submitting a PA on 08/03/23 on Vraylar  but [facility] reports medication is on his current benefit plan, no PA needed.   Please clarify with his pharmacy

## 2023-08-15 ENCOUNTER — Ambulatory Visit: Payer: BC Managed Care – PPO | Admitting: Behavioral Health

## 2023-09-03 ENCOUNTER — Ambulatory Visit: Payer: BC Managed Care – PPO | Admitting: Behavioral Health

## 2023-09-07 ENCOUNTER — Ambulatory Visit: Admitting: Behavioral Health

## 2023-09-07 ENCOUNTER — Encounter: Payer: Self-pay | Admitting: Behavioral Health

## 2023-09-07 DIAGNOSIS — F319 Bipolar disorder, unspecified: Secondary | ICD-10-CM

## 2023-09-07 DIAGNOSIS — F22 Delusional disorders: Secondary | ICD-10-CM

## 2023-09-07 DIAGNOSIS — F411 Generalized anxiety disorder: Secondary | ICD-10-CM | POA: Diagnosis not present

## 2023-09-07 NOTE — Progress Notes (Signed)
 [name]  Patient ID: Chase Mcneil,  MRN: [id number]  PCP: Creola Corn, MD  Date of Evaluation: 09/07/2023 Time spent:30 minutes  Chief Complaint:  Chief Complaint   Anxiety; Depression; Follow-up; Medication Refill; Patient Education; Family Problem     HISTORY/CURRENT STATUS: HPI 28 year old male presents to this office for follow up. His mother and father present with his consent.No social changes since last visit.  Doing much better. No psychosis since last visit.  He is sleeping well with Seroquel.  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 He is denying any illicit substances. Not using cannabis right now. He says his anxiety is 3/10 and depression is 3/10 at this time.He is sleeping about 8 hours per day now with aid of Seroquel.  He denies any more psychosis, or mania. No auditory or visual hallucinations. He denies SI/HI.   Previous psychiatric medication trials:   Abilify- says that he had experienced increased odd behavior according to mother and they stopped administration. Occurred in [place]? Olanzapine-Excessive weight gain. Pt does not want to continue  Lybalvi-Excessive lethargy, daytime sleepiness and fatigue Individual Medical History/ Review of Systems: Changes? :No   Allergies: Patient has no known allergies.  Current Medications:  Current Outpatient Medications:    ALPRAZolam (XANAX) 0.5 MG tablet, 1-2 tablets daily for severe anxiety or before flying. (Patient not taking: Reported on 05/22/2023), Disp: 10 tablet, Rfl: 0   cariprazine (VRAYLAR) 3 MG capsule, Take 1 capsule (3 mg total) by mouth daily., Disp: 30 capsule, Rfl: 3   multivitamin (ONE-A-DAY MEN'S) TABS tablet, Take 1 tablet by mouth daily., Disp: , Rfl:   Current Facility-Administered Medications:    0.9 %  sodium chloride infusion, 500 mL, Intravenous, Continuous, Hilarie Fredrickson, MD Medication Side Effects: none  Family Medical/ Social History: Changes? No  MENTAL  HEALTH EXAM:  There were no vitals taken for this visit.There is no height or weight on file to calculate BMI.  General Appearance: Casual, Neat, and Well Groomed  Eye Contact:  Good  Speech:  Clear and Coherent  Volume:  Normal  Mood:  NA  Affect:  Anxious  Thought Process:  Coherent  Orientation:  Full (Time, Place, and Person)  Thought Content: Logical   Suicidal Thoughts:  No  Homicidal Thoughts:  No  Memory:  WNL  Judgement:  Fair  Insight:  Fair  Psychomotor Activity:  Normal  Concentration:  Concentration: Fair  Recall:  [employer] of Knowledge: Fair  Language: Fair  Assets:  Desire for Improvement Resilience Social Support  ADL's:  Intact  Cognition: WNL  Prognosis:  Fair    DIAGNOSES: No diagnosis found.  Receiving Psychotherapy: No    RECOMMENDATIONS:   Greater than 50% of 30  face to face time with patient was spent on counseling and coordination of care. Vraylar has improved his moods. No psychosis, paranoia, or hallucinations. Encouraged him to get out the house in the am after breakfast and utilize his gym membership. Coached him on good sleep hygiene and sleep wake cycle. Reviewed his medications  with him.    Agreed to:   Continue Vraylar 3 mg daily.   He will report unusual behaviors or return of other symptoms immediately.  Pt wanted to stop Seroquel for sleep Will report side effects or worsening symptoms promptly To follow up in 12 weeks to reassess Reviewed recent labs Provided emergency contact information Discussed potential metabolic side effects associated with atypical antipsychotics, as well  as potential risk for movement side effects. Advised pt to contact office if movement side effects occur.   Reviewed labs from PCP  Reviewed PDMP    Joan Flores, NP

## 2023-09-26 ENCOUNTER — Telehealth: Payer: Self-pay | Admitting: Internal Medicine

## 2023-09-26 NOTE — Telephone Encounter (Signed)
 Inbound call from patient's father stating patient has still been having abdominal pain and diarrhea. States when he takes imodium it gives him constipation so he does not take medication every day.  States patient travelled to [place] over a year ago and unsure if that has a factor.Father is requesting a call back to discuss further and other options. Please advise, thank you.

## 2023-09-26 NOTE — Telephone Encounter (Signed)
 Pts father calling, states that his son complains about his stomach hurting on a regular basis. He continues to have diarrhea but does not take the immodium much as it then constipates him. Suggested he try taking 1/2 tab of Imodium. Father wants to know what he can take/try for the stomach discomfort. Please advise.

## 2023-09-26 NOTE — Telephone Encounter (Signed)
 Xifaxan 550 mg p.o. 3 times daily x 2 weeks.  This is for diarrhea predominant IBS. Bentyl 20 mg; #60; 1 p.o. every 4 hours as needed pain

## 2023-09-27 ENCOUNTER — Other Ambulatory Visit (HOSPITAL_COMMUNITY): Payer: Self-pay

## 2023-09-27 ENCOUNTER — Telehealth: Payer: Self-pay

## 2023-09-27 MED ORDER — RIFAXIMIN 550 MG PO TABS: 550.0000 mg | ORAL_TABLET | Freq: Three times a day (TID) | ORAL | 0 refills | Status: DC

## 2023-09-27 MED ORDER — DICYCLOMINE HCL 20 MG PO TABS: 20.0000 mg | ORAL_TABLET | ORAL | 0 refills | Status: DC | PRN

## 2023-09-27 NOTE — Telephone Encounter (Signed)
 Pharmacy Patient Advocate Encounter   Received notification from Pt Calls Messages that prior authorization for Xifaxan 550MG  tablets is required/requested.   Insurance verification completed.   The patient is insured through [employer] .   Prior Authorization for Xifaxan 550MG  tablets has been APPROVED from 08-28-2023 to 10-11-2023   PA #/Case ID/Reference #: [id number]

## 2023-09-27 NOTE — Telephone Encounter (Signed)
 Xifaxan PA approved - resent rx to pharmacy with approval in comments

## 2023-09-27 NOTE — Telephone Encounter (Signed)
 Spoke with pts father and let him know the xifaxan PA has been approved. He thanked me for the call.

## 2023-09-27 NOTE — Telephone Encounter (Signed)
 Spoke with pts father and he is aware of recommendations per Dr Marina Goodell, scripts sent to pharmacy. He knows the xifaxan may require PA and can take up to 5 business days for approval.  Xifaxan may need PA

## 2023-09-27 NOTE — Telephone Encounter (Signed)
 PA request has been Approved. New Encounter has been or will be created for follow up. For additional info see Pharmacy Prior Auth telephone encounter from 09/27/2023.

## 2023-10-10 NOTE — Telephone Encounter (Addendum)
 Patients mother called and stated the patient is having issues with diarrhea and the patient also needs to get directions on the two medications given Xifaxan and Bentyl.

## 2023-10-10 NOTE — Telephone Encounter (Signed)
 Spoke to patient's father, Molli Hazard (okay per DPR) who states that patient continues to have diarrhea despite being on day 12 of 14 in his Xifaxan regimen for IBS-D. Molli Hazard indicates that patient has not taken any dicyclomine because it indicates that medication is for spasms and patient has not had this. I advised that dicyclomine is used for crampy abdominal discomfort and actually relaxes the GI tract as well which can improve diarrhea symptoms. Advised that patient take dicyclomine 1 tablet every 4-6 hours on a scheduled basis for around 1 week to see if this is helpful with his diarrhea symptoms. If continued no improvement in symptoms, he will call back again.

## 2023-10-14 ENCOUNTER — Other Ambulatory Visit: Payer: Self-pay | Admitting: Behavioral Health

## 2023-10-14 DIAGNOSIS — F411 Generalized anxiety disorder: Secondary | ICD-10-CM

## 2023-10-14 DIAGNOSIS — F319 Bipolar disorder, unspecified: Secondary | ICD-10-CM

## 2023-10-29 ENCOUNTER — Telehealth: Payer: Self-pay | Admitting: Internal Medicine

## 2023-10-29 NOTE — Telephone Encounter (Signed)
 Patient mother called and stated that her son can not keep anything down and is still having diarrhea. Mother stated that her son can eat food prepared at home and that food will sometimes stay in his stomach. She also stated that whenever her son gets processed foods it will go right through him even sometimes will vomit the food right back up. Patient mother will like a call back tomorrow at the latest. She stated that a good call back number for would be her husband [phone] due to her being hard of hearing. Please advise.

## 2023-10-30 NOTE — Telephone Encounter (Addendum)
 I have spoken to patient's father, Chase Mcneil who states that Chase Mcneil is still having GI upset with diarrhea and vomiting pretty frequently worse following certain foods. Zoila Hines wonders if there is any food allergy testing or other testing that can be done to find out what the underlying issue is. It is not clear if patient is taking citrucel 2 tbsp daily as recommended at 05/2023 office visit nor if he is using imodium.   We discussed low FODMAP diet and citrucel. Zoila Hines would like to sign up for MyChart so that FODMAP info can be sent to him. Patient has been scheduled for a follow up with Dr Elvin Hammer on 12/20/23 as well.

## 2023-11-01 NOTE — Telephone Encounter (Signed)
 Patient father requesting a call back to discuss previous note. Please advise.   Thank you

## 2023-11-01 NOTE — Telephone Encounter (Signed)
 Patient's father called to let us  know that MyChart has been activated. Advised that I did send a copy of FODMAP diet for patient to look over. Father also wants Dr Elvin Hammer to be aware that patients potassium "was a little high" at his PCP yesterday (we cannot yet access these results) and wonders if this could be related to patients diarrhea/vomiting episodes. Patient is scheduled for a follow up with Dr Elvin Hammer on 12/20/23.

## 2023-11-02 ENCOUNTER — Ambulatory Visit (INDEPENDENT_AMBULATORY_CARE_PROVIDER_SITE_OTHER): Admitting: Professional Counselor

## 2023-11-02 ENCOUNTER — Encounter: Payer: Self-pay | Admitting: Professional Counselor

## 2023-11-02 DIAGNOSIS — F411 Generalized anxiety disorder: Secondary | ICD-10-CM

## 2023-11-02 DIAGNOSIS — F319 Bipolar disorder, unspecified: Secondary | ICD-10-CM
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
## 2023-11-07 ENCOUNTER — Other Ambulatory Visit: Payer: Self-pay | Admitting: Internal Medicine

## 2023-11-07 DIAGNOSIS — R7989 Other specified abnormal findings of blood chemistry: Secondary | ICD-10-CM

## 2023-11-12 ENCOUNTER — Telehealth: Payer: Self-pay | Admitting: Behavioral Health

## 2023-11-12 ENCOUNTER — Ambulatory Visit
Admission: RE | Admit: 2023-11-12 | Discharge: 2023-11-12 | Disposition: A | Discharge status: Home / Self Care | Source: Ambulatory Visit | Attending: Internal Medicine | Admitting: Internal Medicine

## 2023-11-12 DIAGNOSIS — R7989 Other specified abnormal findings of blood chemistry: Secondary | ICD-10-CM

## 2023-11-12 NOTE — Telephone Encounter (Signed)
 Noted.

## 2023-11-12 NOTE — Telephone Encounter (Signed)
 Mom called and LM at 10:55 to request refill of Chase Mcneil's Vraylar .  I returned her call and LM that there is a refill available on that medication and to call the pharmacy to have it filled.  I also indicated that Chase Mcneil is due for follow up in June and he needs to call the office to schedule that appt.

## 2023-12-07 ENCOUNTER — Ambulatory Visit: Admitting: Behavioral Health

## 2023-12-12 ENCOUNTER — Ambulatory Visit: Admitting: Behavioral Health

## 2023-12-12 ENCOUNTER — Encounter: Payer: Self-pay | Admitting: Behavioral Health

## 2023-12-12 DIAGNOSIS — F319 Bipolar disorder, unspecified: Secondary | ICD-10-CM

## 2023-12-12 DIAGNOSIS — F411 Generalized anxiety disorder: Secondary | ICD-10-CM

## 2023-12-12 MED ORDER — CARIPRAZINE HCL 3 MG PO CAPS: 3.0000 mg | ORAL_CAPSULE | Freq: Every day | ORAL | 1 refills | Status: DC

## 2023-12-12 NOTE — Progress Notes (Signed)
 [name]  Patient ID: Chase Mcneil,  MRN: [id number]  PCP: Margarete Sharps, MD  Date of Evaluation: 12/12/2023 Time spent:30 minutes  Chief Complaint:  Chief Complaint   Depression; Anxiety; Follow-up; Medication Refill; Patient Education     HISTORY/CURRENT STATUS: HPI 28 year old male presents to this office for follow up. His mother and father present with his consent.No social changes since last visit.  Doing much better. No psychosis since last visit.  He is sleeping well with Seroquel . 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 Is using Cannabis occasionally.  He says his anxiety is 1/10 and depression is 1/10 at this time.He is sleeping about 8 hours per day now with aid of Seroquel .  He denies any more psychosis, or mania. No auditory or visual hallucinations. He denies SI/HI.   Previous psychiatric medication trials:   Abilify- says that he had experienced increased odd behavior according to mother and they stopped administration. Occurred in [place]? Olanzapine -Excessive weight gain. Pt does not want to continue  Lybalvi -Excessive lethargy, daytime sleepiness and fatigue Individual Medical History/ Review of Systems: Changes? :No   Allergies: Patient has no known allergies.  Current Medications:  Current Outpatient Medications:    ALPRAZolam  (XANAX ) 0.5 MG tablet, 1-2 tablets daily for severe anxiety or before flying. (Patient not taking: Reported on 05/22/2023), Disp: 10 tablet, Rfl: 0   cariprazine  (VRAYLAR ) 3 MG capsule, Take 1 capsule (3 mg total) by mouth daily., Disp: 30 capsule, Rfl: 1   dicyclomine  (BENTYL ) 20 MG tablet, Take 1 tablet (20 mg total) by mouth every 4 (four) hours as needed for spasms., Disp: 60 tablet, Rfl: 0   multivitamin (ONE-A-DAY MEN'S) TABS tablet, Take 1 tablet by mouth daily., Disp: , Rfl:    rifaximin  (XIFAXAN ) 550 MG TABS tablet, Take 1 tablet (550 mg total) by mouth 3 (three) times daily., Disp: 42 tablet, Rfl: 0  Current  Facility-Administered Medications:    0.9 %  sodium chloride  infusion, 500 mL, Intravenous, Continuous, Tobin Forts, MD Medication Side Effects: none  Family Medical/ Social History: Changes? No  MENTAL HEALTH EXAM:  Weight 201 lb (91.2 kg).Body mass index is 28.43 kg/m.  General Appearance: Casual, Neat, and Well Groomed  Eye Contact:  Good  Speech:  Clear and Coherent  Volume:  Normal  Mood:  NA  Affect:  Appropriate  Thought Process:  Coherent  Orientation:  Full (Time, Place, and Person)  Thought Content: Logical   Suicidal Thoughts:  No  Homicidal Thoughts:  No  Memory:  WNL  Judgement:  Good  Insight:  Good  Psychomotor Activity:  Normal  Concentration:  Concentration: Good  Recall:  Good  Fund of Knowledge: Good  Language: Good  Assets:  Desire for Improvement  ADL's:  Intact  Cognition: WNL  Prognosis:  Good    DIAGNOSES:    ICD-10-CM   1. Bipolar I disorder (HCC)  F31.9 cariprazine  (VRAYLAR ) 3 MG capsule    2. Generalized anxiety disorder  F41.1 cariprazine  (VRAYLAR ) 3 MG capsule      Receiving Psychotherapy: No    RECOMMENDATIONS:   Greater than 50% of 30  face to face time with patient was spent on counseling and coordination of care. Vraylar  has improved his moods. No psychosis, paranoia, or hallucinations. Encouraged him to get out the house in the am after breakfast and utilize his gym membership. Coached him on good sleep hygiene and sleep wake cycle. Reviewed his medications  with him.    Agreed to:  Continue Vraylar  3 mg daily.   He will report unusual behaviors or return of other symptoms immediately.  Pt wanted to stop Seroquel  for sleep Will report side effects or worsening symptoms promptly To follow up in 12 weeks to reassess Reviewed recent labs Provided emergency contact information Discussed potential metabolic side effects associated with atypical antipsychotics, as well as potential risk for movement side effects. Advised pt to  contact office if movement side effects occur.   Reviewed labs from PCP  Reviewed PDMP  Lincoln Renshaw, NP

## 2023-12-20 ENCOUNTER — Ambulatory Visit: Payer: Self-pay | Admitting: Internal Medicine

## 2023-12-31 ENCOUNTER — Encounter: Payer: Self-pay | Admitting: Professional Counselor

## 2023-12-31 ENCOUNTER — Ambulatory Visit: Admitting: Professional Counselor

## 2023-12-31 DIAGNOSIS — F319 Bipolar disorder, unspecified: Secondary | ICD-10-CM | POA: Diagnosis not present

## 2023-12-31 DIAGNOSIS — F4010 Social phobia, unspecified: Secondary | ICD-10-CM

## 2023-12-31 DIAGNOSIS — F411 Generalized anxiety disorder: Secondary | ICD-10-CM
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
## 2024-01-07 ENCOUNTER — Ambulatory Visit: Admitting: Dermatology

## 2024-01-14 ENCOUNTER — Encounter: Payer: Self-pay | Admitting: Professional Counselor

## 2024-01-14 ENCOUNTER — Ambulatory Visit: Admitting: Professional Counselor

## 2024-01-14 ENCOUNTER — Telehealth: Payer: Self-pay | Admitting: Behavioral Health

## 2024-01-14 DIAGNOSIS — F319 Bipolar disorder, unspecified: Secondary | ICD-10-CM | POA: Diagnosis not present

## 2024-01-14 DIAGNOSIS — F411 Generalized anxiety disorder: Secondary | ICD-10-CM

## 2024-01-14 NOTE — Telephone Encounter (Signed)
 Patient called in for refill on Vraylar  3mg . Ph: [phone] Appt 9/11 [facility] [address] Chase Mcneil

## 2024-01-14 NOTE — Progress Notes (Signed)
      [facility] Counselor/Therapist Progress Note  Patient ID: Chase Mcneil, MRN: [id number],    Date: 01/16/2024  Time Spent: 10:15: AM - 11:10 AM   Treatment Type: Individual Therapy  Reported Symptoms: worries, nervousness, anxiousness, sleep concerns, self esteem concerns, interpersonal concerns  Mental Status Exam:  Appearance:   Casual     Behavior:  Appropriate, Sharing, and Motivated  Motor:  Normal  Speech/Language:   Clear and Coherent and Normal Rate  Affect:  Appropriate and Congruent  Mood:  normal  Thought process:  normal  Thought content:    WNL  Sensory/Perceptual disturbances:    WNL  Orientation:  oriented to person, place, time/date, and situation  Attention:  Good  Concentration:  Good  Memory:  WNL  Fund of knowledge:   Good  Insight:    Good  Judgment:   Good  Impulse Control:  Good   Risk Assessment: Danger to Self:  No Self-injurious Behavior: No Danger to Others: No Duty to Warn:no Physical Aggression / Violence:No  Access to Firearms a concern: No  Gang Involvement:No   Subjective: Patient presented to session to address concerns of bipolar disorder and anxiety.  He reported mood stability as well-managed by medication at this time, with anxiety symptoms ongoing.  Patient identified engaging in daily edible cannabis use, and counselor actively listened to patient experience, and provided harm reduction psychoeducation around his psychotropic medications and potential for exacerbation of unwanted symptomology.  Patient identified his medication Vraylar  as helping him to be calm but that cannabis provides a deeper peace.  Counselor recommended patient utilize Calm or like app to resource self-soothing interventions.  Patient explored thoughts about dating, and employment, and counselor assisted with strategies for enhancing motivation and self-confidence across spheres.  Interventions: Cognitive Behavioral Therapy, Assertiveness/Communication,  Solution-Oriented/Positive Psychology, Humanistic/Existential, Insight-Oriented, and Resourcing  Diagnosis:   ICD-10-CM   1. Bipolar I disorder (HCC)  F31.9     2. Generalized anxiety disorder  F41.1       Plan: Pt is schedule for a follow-up; continue process work and developing coping skills. STG between sessions to be mindful of pacing in relational sphere, practice positive self talk and balancing expectations to limit worry and catastrophic thinking; consider utilizing Calm or similar app for help with relaxation technique prompts.  [name], [facility]

## 2024-01-14 NOTE — Telephone Encounter (Signed)
 LVM per DPR that he has a RF.

## 2024-01-28 ENCOUNTER — Ambulatory Visit: Admitting: Professional Counselor

## 2024-01-28 ENCOUNTER — Encounter: Payer: Self-pay | Admitting: Professional Counselor

## 2024-01-28 DIAGNOSIS — F319 Bipolar disorder, unspecified: Secondary | ICD-10-CM | POA: Diagnosis not present

## 2024-01-28 DIAGNOSIS — F411 Generalized anxiety disorder: Secondary | ICD-10-CM | POA: Diagnosis not present

## 2024-01-28 NOTE — Progress Notes (Signed)
      [facility] Counselor/Therapist Progress Note  Patient ID: JOBIN Mcneil, MRN: [id number],    Date: 01/28/2024  Time Spent: 3:03 PM - 4:00 PM   Treatment Type: Individual Therapy  Reported Symptoms: motivational concerns, sleep concerns, self esteem concerns, worries, frustration, interpersonal concerns, phase of life concerns  Mental Status Exam:  Appearance:   Casual     Behavior:  Appropriate and Sharing  Motor:  Normal  Speech/Language:   Clear and Coherent and Normal Rate  Affect:  Appropriate and Congruent  Mood:  normal  Thought process:  normal  Thought content:    WNL  Sensory/Perceptual disturbances:    WNL  Orientation:  oriented to person, place, time/date, and situation  Attention:  Good  Concentration:  Good  Memory:  WNL  Fund of knowledge:   Good  Insight:    Good  Judgment:   Good  Impulse Control:  Good   Risk Assessment: Danger to Self:  No Self-injurious Behavior: No Danger to Others: No Duty to Warn:no Physical Aggression / Violence:No  Access to Firearms a concern: No  Gang Involvement:No   Subjective: Patient presented to session to address concerns of bipolar disorder and anxiety.  He reported mixed progress at this time.  He processed experience of parents making decision to sell family home, for which he is very sad and grieving the change and loss.  Patient shared regarding his love of family home and grand parents home next-door.  He processed experience of having a date recently that went well, and voiced continuing to look for a job.  He reported experience of Vraylar  medication to be going well, and to not be taking Seroquel  at this time; he endorsed use of THC to self-medicate.  He processed experience of challenging dynamics with his parents.  Counselor actively listened, affirmed patient feelings and experience, helped facilitate insight, helped patient resource intrinsic motivation around job search, possible return to school, and  increasing social opportunities, and helped with strategies of interpersonal effectiveness in parental relationship concerns.  Interventions: Motivational Interviewing, Solution-Oriented/Positive Psychology, Humanistic/Existential, and Insight-Oriented  Diagnosis:   ICD-10-CM   1. Bipolar I disorder (HCC)  F31.9     2. 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 STG between sessions for pt is to practice socialization skills including as relates dating, and to continue with momentum around current job opportunity. Consider looking into return to school.  [name], [facility]

## 2024-02-11 ENCOUNTER — Ambulatory Visit (INDEPENDENT_AMBULATORY_CARE_PROVIDER_SITE_OTHER): Admitting: Professional Counselor

## 2024-02-11 DIAGNOSIS — F411 Generalized anxiety disorder: Secondary | ICD-10-CM | POA: Diagnosis not present

## 2024-02-11 DIAGNOSIS — F319 Bipolar disorder, unspecified: Secondary | ICD-10-CM

## 2024-02-11 NOTE — Progress Notes (Signed)
      [facility] Counselor/Therapist Progress Note  Patient ID: Chase Mcneil, MRN: [id number],    Date: 02/11/2024  Time Spent: 4:16 PM - 5:19 PM   Treatment Type: Family with patient  Patient parents present with patient for session.  Reported Symptoms: low mood, excess sleep, low motivation, interpersonal concerns, phase of life concerns, career concerns, stress, substance use concerns, self esteem concerns, worries, irritability, nervousness  Mental Status Exam:  Appearance:   Casual     Behavior:  Appropriate and Sharing  Motor:  Normal  Speech/Language:   Clear and Coherent and Normal Rate  Affect:  Appropriate and Congruent  Mood:  normal  Thought process:  normal  Thought content:    WNL  Sensory/Perceptual disturbances:    WNL  Orientation:  oriented to person, place, time/date, and situation  Attention:  Good  Concentration:  Good  Memory:  WNL  Fund of knowledge:   Good  Insight:    Good  Judgment:   Good  Impulse Control:  Good   Risk Assessment: Danger to Self:  No Self-injurious Behavior: No Danger to Others: No Duty to Warn:no Physical Aggression / Violence:No  Access to Firearms a concern: No  Gang Involvement:No   Subjective: Patient presented to session to address concerns of bipolar disorder and anxiety.  He presented to session with his parents.  Patient reported mixed progress at this time.  He reported continuing to have hope to find work with his friends.  Patient's parents voiced concern that patient is unmotivated, not looking for jobs, tendency to socially isolate, and about his use of THC to self-medicate.  They processed their observations around patient psychosis and impact on self-esteem.  Counselor provided psychoeducation regarding patient prescribed medications and THC use, and encouraged patient to continue discussions with prescribing provider regarding potential efficacy implications/interactions as well.  Counselor reinforced the value  of a job for patient to gain in soft and hard skills, have income, resume material, references, and social interaction.  Counselor assisted patient and his parents in communicating, and together they collaborated on shared goals for patient improvement.  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  He identified intention to not use marijuana at home, and contribute to home life more including cooking.  Interventions: Solution-Oriented/Positive Psychology, Humanistic/Existential, Insight-Oriented, and Interpersonal  Diagnosis:   ICD-10-CM   1. Bipolar I disorder (HCC)  F31.9     2. Generalized anxiety disorder  F41.1       Plan: Patient is scheduled for follow-up; continue process work and developing coping skills.  STG between sessions for patient to work with father 2 days a week for an hour each day on resume and cover letter creation, and applying for jobs; shop and cook for family 2 meals a week; practice harm reduction as relates substance in home environment.  Practice collaborative discursive skill set with parents.  Reflect on ideas for healthy self enrichment and/or social opportunities outside of the home.  [name], [facility]

## 2024-02-14 ENCOUNTER — Telehealth: Payer: Self-pay | Admitting: Behavioral Health

## 2024-02-14 DIAGNOSIS — F411 Generalized anxiety disorder: Secondary | ICD-10-CM

## 2024-02-14 DIAGNOSIS — F319 Bipolar disorder, unspecified: Secondary | ICD-10-CM

## 2024-02-14 MED ORDER — CARIPRAZINE HCL 3 MG PO CAPS: 3.0000 mg | ORAL_CAPSULE | Freq: Every day | ORAL | 1 refills | Status: DC

## 2024-02-14 NOTE — Telephone Encounter (Signed)
 Pt called at 1:54p requesting refill of Vraylar  to   [id number] - [place], [place] - [address] AT [facility] [address] [place] [address] Phone: [phone]  Fax: [phone]   Next appt 9/11

## 2024-02-14 NOTE — Telephone Encounter (Signed)
 sent

## 2024-02-25 ENCOUNTER — Ambulatory Visit (INDEPENDENT_AMBULATORY_CARE_PROVIDER_SITE_OTHER): Admitting: Professional Counselor

## 2024-02-25 ENCOUNTER — Telehealth: Payer: Self-pay | Admitting: Internal Medicine

## 2024-02-25 ENCOUNTER — Encounter: Payer: Self-pay | Admitting: Professional Counselor

## 2024-02-25 DIAGNOSIS — F319 Bipolar disorder, unspecified: Secondary | ICD-10-CM | POA: Diagnosis not present

## 2024-02-25 DIAGNOSIS — F411 Generalized anxiety disorder: Secondary | ICD-10-CM

## 2024-02-25 NOTE — Telephone Encounter (Signed)
 Inbound call from patients mother stating she would like to speak with the nurse. She states that patient has been dealing with diarrhea for the last 3 years and he is currently scheduled to see Ellouise Console on 10/17 at 8:20. Patients mother is requesting a call to discuss further testing to figure out what is causing this issue. Please advise.

## 2024-02-25 NOTE — Telephone Encounter (Signed)
 Patient's mother calls and wants to know if Dr Abran will order testing like colonoscopy before patient's upcoming scheduled appointment 04/2024 to find out why patient continues to have diarrhea. 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 I advised that unfortunately, Chase Mcneil will need to be re-evaluated in the office as scheduled prior to any testing being ordered. Mother states she wants Dr Abran to see her son before October. I advised that unfortunately, this is the first available date we have at this time. Patient was previously scheduled in June for appointment with Dr Abran for evaluation of persistent diarrhea but later cancelled that appointment because he was feeling better. I have added patient to the wait list should we have any cancellations. She verbalizes understanding.

## 2024-02-25 NOTE — Progress Notes (Signed)
      [facility] Counselor/Therapist Progress Note  Patient ID: Chase Mcneil, MRN: [id number],    Date: 02/25/2024  Time Spent: 4:09 PM to 5:12 PM  Treatment Type: Individual Therapy  Reported Symptoms: Self-esteem concerns, motivational concerns, sleep concerns, nervousness, anxiousness, worries, trouble relaxing, irritability, career concerns, interpersonal concerns, phase of life concerns  Mental Status Exam:  Appearance:   Casual     Behavior:  Appropriate and Sharing  Motor:  Normal  Speech/Language:   Clear and Coherent and Normal Rate  Affect:  Appropriate and Congruent  Mood:  normal  Thought process:  normal  Thought content:    WNL  Sensory/Perceptual disturbances:    WNL  Orientation:  oriented to person, place, time/date, and situation  Attention:  Good  Concentration:  Good  Memory:  WNL  Fund of knowledge:   Good  Insight:    Good  Judgment:   Good  Impulse Control:  Good   Risk Assessment: Danger to Self:  No Self-injurious Behavior: No Danger to Others: No Duty to Warn:no Physical Aggression / Violence:No  Access to Firearms a concern: No  Gang Involvement:No   Subjective: Patient presented to session to address concerns of bipolar disorder and anxiety.  He reported bipolar symptomology to be well-managed with medication at this time, and to continue to have anxiety and some mild depression around low motivation and career concerns.  Counselor and patient discussed family session prior, and patient identified having set boundaries and rules with parents as helpful, and for him to be cooking twice a week as discussed for contribution to home life.  Patient reported to not have been looking for a job as discussed, and to be looking into buying a new car and taking a course about how to make money.  Patient identified having not smoked marijuana for a week, as an experiment.  He reported intention to return to cannabis use in spite of parents concerns,  expressing his enjoyment of the substance.  Counselor utilized MI to encourage patient to consider developing coping skills and sense of peace as alternative to self-medicating.  Counselor provided information on cognitive distortions with patient and patient identified magnification and minimization, overt generalization, jumping to conclusions and should self talk as resonating for him.  He identified automatic negative thoughts around fear of failure, and counselor help patient to resource corrective thoughts.  Counselor provided Socratic questioning handout and patient identified past feelings and events as limiting his attempts to try things again.  Counselor encouraged patient to utilize worksheets between sessions to increase cognitive restructuring.  Counselor suggested conferences and job fairs as ways for patient to resource job opportunities.  Interventions: Cognitive Behavioral Therapy, Motivational Interviewing, Solution-Oriented/Positive Psychology, Humanistic/Existential, and Insight-Oriented  Diagnosis:   ICD-10-CM   1. Bipolar I disorder (HCC)  F31.9     2. Generalized anxiety disorder  F41.1       Plan: Patient is scheduled for follow-up; continue process work and developing coping skills.  STG between sessions to research job fears and conferences around patient interest, resource CBT handouts provided in session for further reflection and practice of cognitive restructuring.  [name], [facility]

## 2024-03-04 ENCOUNTER — Ambulatory Visit: Admitting: Professional Counselor

## 2024-03-10 ENCOUNTER — Ambulatory Visit (INDEPENDENT_AMBULATORY_CARE_PROVIDER_SITE_OTHER): Admitting: Professional Counselor

## 2024-03-10 ENCOUNTER — Encounter: Payer: Self-pay | Admitting: Professional Counselor

## 2024-03-10 DIAGNOSIS — F319 Bipolar disorder, unspecified: Secondary | ICD-10-CM | POA: Diagnosis not present

## 2024-03-10 NOTE — Progress Notes (Signed)
      [facility] Counselor/Therapist Progress Note  Patient ID: KALONJI ZURAWSKI, MRN: [id number],    Date: 03/10/2024  Time Spent: 4:20 PM to 5:20 PM  Treatment Type: Individual Therapy  Reported Symptoms: Motivational concerns, substance abuse concerns, preoccupying thoughts, self-esteem concerns, nervousness, interpersonal concerns, phase of life concerns, career concerns  Mental Status Exam:  Appearance:   Casual     Behavior:  Appropriate and Sharing  Motor:  Normal  Speech/Language:   Clear and Coherent and Normal Rate  Affect:  Appropriate and Congruent  Mood:  normal  Thought process:  normal  Thought content:    WNL  Sensory/Perceptual disturbances:    WNL  Orientation:  oriented to person, place, time/date, and situation  Attention:  Good  Concentration:  Good  Memory:  WNL  Fund of knowledge:   Good  Insight:    Good  Judgment:   Good  Impulse Control:  Good   Risk Assessment: Danger to Self:  No Self-injurious Behavior: No Danger to Others: No Duty to Warn:no Physical Aggression / Violence:No  Access to Firearms a concern: No  Gang Involvement:No   Subjective: Patient presented to session to address concerns of bipolar disorder.  He reported progress at this time.  He reported Vraylar  medication to be helpful in stabilizing mood.  He reported to be on the fence about taking a class, to have no change regarding an appointment, and to have had conflict with his parents around his use of marijuana at the home.  Patient reported to still be cooking.  Counselor utilized motivational interviewing to help patient resource goal planning, thinking about what he would like to have happened in his life in 5 years, 3 years 1 years, and what he could be doing in the next days weeks and months to reach his goals.  Patient identified desire for wealth in the future, with thoughts of investing and finding work that he enjoys in the meantime.  Counselor and patient discussed patient  small steps to build towards dreams, including even a part-time or temp job that could help him get a job he really wants.  Counselor assisted patient in identifying short-term goals.  Counselor facilitated a mindfulness meditation that included body scan and breath work, having patient ponder his intention for the week including how he shows up with the best impact, quality of energy does he wishes to strengthen and develop, and what does he need to do to take better care of himself.  Interventions: Mindfulness Meditation, Motivational Interviewing, Solution-Oriented/Positive Psychology, Humanistic/Existential, and Insight-Oriented  Diagnosis:   ICD-10-CM   1. Bipolar I disorder (HCC)  F31.9       Plan: Patient is scheduled for follow-up; continue process work and developing coping skills.  STG between sessions for patient to meditate with resources provided and reflect on intention/purpose per prompts; increase exercise including more walks and gym going, continue to apply to jobs, continue cooking for family with regularity, and observe parents rules for patient residing with them.  [name], [facility]

## 2024-03-13 ENCOUNTER — Ambulatory Visit: Admitting: Behavioral Health

## 2024-03-17 ENCOUNTER — Ambulatory Visit: Admitting: Professional Counselor

## 2024-03-18 ENCOUNTER — Encounter: Payer: Self-pay | Admitting: Behavioral Health

## 2024-03-18 ENCOUNTER — Ambulatory Visit (INDEPENDENT_AMBULATORY_CARE_PROVIDER_SITE_OTHER): Admitting: Behavioral Health

## 2024-03-18 DIAGNOSIS — F319 Bipolar disorder, unspecified: Secondary | ICD-10-CM

## 2024-03-18 DIAGNOSIS — F411 Generalized anxiety disorder: Secondary | ICD-10-CM | POA: Diagnosis not present

## 2024-03-18 MED ORDER — CARIPRAZINE HCL 1.5 MG PO CAPS: 1.5000 mg | ORAL_CAPSULE | Freq: Every day | ORAL | 5 refills | Status: AC

## 2024-03-18 NOTE — Progress Notes (Addendum)
 [name]  Patient ID: Chase Mcneil,  MRN: [id number]  PCP: Chase Rush, MD  Date of Evaluation: 03/18/2024 Time spent:30 minutes  Chief Complaint:  Chief Complaint   Depression; Anxiety; Follow-up; Medication Refill; Patient Education; Stress; Drug Problem     HISTORY/CURRENT STATUS: HPI Chase Mcneil, 28 year old male presents to this office for follow up. His mother and father present with his consent.No social changes since last visit.  Mother is with him with his verbal consent.  She is very upset that Chase Mcneil is using THC and high quantities.  They are arguing about him still unable to find a job and still living at home.  She says he is not motivated and not doing anything to help himself.  He would like to see him get involved with the group at [employer] that helps people network for jobs.  Chase Mcneil says that he is not doing much THC and is trying to be careful.  Says that it does not help that his father is using THC as well.  He does say that Vraylar  is still working very well and has not experienced any symptoms of psychosis he is sleeping well with Seroquel .  He says his anxiety is 1/10 and depression is 1/10 at this time.He is sleeping about 8 hours per day now with aid of Seroquel .  He denies any more psychosis, or mania. No auditory or visual hallucinations. He denies SI/HI.   Previous psychiatric medication trials:   Abilify- says that he had experienced increased odd behavior according to mother and they stopped administration. Occurred in [place]? Olanzapine -Excessive weight gain. Pt does not want to continue  Lybalvi -Excessive lethargy, daytime sleepiness and fatigue   Individual Medical History/ Review of Systems: Changes? :No   Allergies: Patient has no known allergies.  Current Medications:  Current Outpatient Medications:    cariprazine  (VRAYLAR ) 1.5 MG capsule, Take 1 capsule (1.5 mg total) by mouth daily., Disp: 30 capsule, Rfl: 5   ALPRAZolam   (XANAX ) 0.5 MG tablet, 1-2 tablets daily for severe anxiety or before flying. (Patient not taking: Reported on 05/22/2023), Disp: 10 tablet, Rfl: 0   cariprazine  (VRAYLAR ) 3 MG capsule, Take 1 capsule (3 mg total) by mouth daily., Disp: 30 capsule, Rfl: 1   dicyclomine  (BENTYL ) 20 MG tablet, Take 1 tablet (20 mg total) by mouth every 4 (four) hours as needed for spasms., Disp: 60 tablet, Rfl: 0   multivitamin (ONE-A-DAY MEN'S) TABS tablet, Take 1 tablet by mouth daily., Disp: , Rfl:    rifaximin  (XIFAXAN ) 550 MG TABS tablet, Take 1 tablet (550 mg total) by mouth 3 (three) times daily., Disp: 42 tablet, Rfl: 0  Current Facility-Administered Medications:    0.9 %  sodium chloride  infusion, 500 mL, Intravenous, Continuous, Chase Mcneil SAILOR, MD Medication Side Effects: none  Family Medical/ Social History: Changes? No  MENTAL HEALTH EXAM:  There were no vitals taken for this visit.There is no height or weight on file to calculate BMI.  General Appearance: Casual and Well Groomed  Eye Contact:  Good  Speech:  Clear and Coherent  Volume:  Normal  Mood:  NA  Affect:  Appropriate  Thought Process:  Coherent  Orientation:  Full (Time, Place, and Person)  Thought Content: Logical   Suicidal Thoughts:  No  Homicidal Thoughts:  No  Memory:  WNL  Judgement:  Good  Insight:  Good  Psychomotor Activity:  Normal  Concentration:  Concentration: Good  Recall:  Good  Fund of Knowledge: Good  Language: Good  Assets:  Desire for Improvement  ADL's:  Intact  Cognition: WNL  Prognosis:  Good    DIAGNOSES:    ICD-10-CM   1. Bipolar I disorder (HCC)  F31.9 cariprazine  (VRAYLAR ) 1.5 MG capsule    2. Generalized anxiety disorder  F41.1 cariprazine  (VRAYLAR ) 1.5 MG capsule      Receiving Psychotherapy: yes, Chase Mcneil bi weekly   RECOMMENDATIONS:   Greater than 50% of 30  face to face time with patient was spent on counseling and coordination of care.  Vraylar  has continued to provide  good stability, however [name] he is self-medicating with THC usually in the form of Gummies or occasional smoking.  I highly cautioned him that high doses of THC can trigger psychosis especially in someone who has history.  I do believe that [name] struggles with self-confidence and motivation.  He is not showing much initiative to leave home or acquire a job.  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  Coached him on good sleep hygiene and sleep wake cycle. Reviewed his medications  with him.    Agreed to:   To reduce Vraylar  to 1.5 mg daily.   He will report unusual behaviors or return of other symptoms immediately.  Will report side effects or worsening symptoms promptly To follow up in 6 months to reassess Reviewed recent labs Provided emergency contact information Discussed potential metabolic side effects associated with atypical antipsychotics, as well as potential risk for movement side effects. Advised pt to contact office if movement side effects occur.   Reviewed labs from PCP  Reviewed PDMP   Chase DELENA Pizza, NP

## 2024-03-24 ENCOUNTER — Ambulatory Visit: Admitting: Professional Counselor

## 2024-03-24 ENCOUNTER — Encounter: Payer: Self-pay | Admitting: Professional Counselor

## 2024-03-24 DIAGNOSIS — F411 Generalized anxiety disorder: Secondary | ICD-10-CM

## 2024-03-24 DIAGNOSIS — F319 Bipolar disorder, unspecified: Secondary | ICD-10-CM

## 2024-03-24 NOTE — Progress Notes (Signed)
      [facility] Counselor/Therapist Progress Note  Patient ID: Chase Mcneil, MRN: [id number],    Date: 03/24/2024  Time Spent: 3:11 PM to 4:12 PM  Treatment Type: Individual Therapy  Reported Symptoms: Anxiousness, worries, stress, low motivation, substance use concerns, phase of life concerns  Mental Status Exam:  Appearance:   Neat     Behavior:  Appropriate and Sharing  Motor:  Normal  Speech/Language:   Clear and Coherent and Normal Rate  Affect:  Appropriate and Congruent  Mood:  normal  Thought process:  normal  Thought content:    WNL  Sensory/Perceptual disturbances:    WNL  Orientation:  oriented to person, place, time/date, and situation  Attention:  Good  Concentration:  Good  Memory:  WNL  Fund of knowledge:   Good  Insight:    Good  Judgment:   Good  Impulse Control:  Good   Risk Assessment: Danger to Self:  No Self-injurious Behavior: No Danger to Others: No Duty to Warn:no Physical Aggression / Violence:No  Access to Firearms a concern: No  Gang Involvement:No   Subjective: Patient presented to session to address concerns of bipolar and anxiety.  He reported mixed progress at this time.  He reported progress and going to the gym 3 days a week, and reported to be still looking for a car and a job.  Counselor and patient discussed patient job search and patient's need and desire for autonomy.  They discussed patient's sense of intrinsic motivation, and what might be compelling him personally versus externally to compel him to change his circumstances, and what might be unhelpful in reaching his goals, such as self-medicating with marijuana.  Patient endorsed practicing harm reduction around substance use.  Counselor assisted patient with ideas for volunteerism and/or employment, and provided patient with resources for [employer] works to assist in career development efforts.  Counselor facilitated MBSR meditation around themes of intention and patient identified  goals for between sessions (see below).   Interventions: Motivational Interviewing, Solution-Oriented/Positive Psychology, Humanistic/Existential, Insight-Oriented, and MBSR  Diagnosis:   ICD-10-CM   1. Bipolar I disorder (HCC)  F31.9     2. Generalized anxiety disorder  F41.1       Plan: Patient is scheduled for follow-up; continue process work and developing coping skills.  STG between sessions to apply to more jobs so as to show up with best impacting his life, resource the quality of energy of intrinsic motivation, increase self-care and motivation as relates less screen time and marijuana use.  [name], [facility]

## 2024-04-01 ENCOUNTER — Ambulatory Visit: Admitting: Professional Counselor

## 2024-04-14 ENCOUNTER — Encounter: Payer: Self-pay | Admitting: Professional Counselor

## 2024-04-14 ENCOUNTER — Ambulatory Visit: Admitting: Professional Counselor

## 2024-04-14 DIAGNOSIS — F411 Generalized anxiety disorder: Secondary | ICD-10-CM

## 2024-04-14 DIAGNOSIS — F319 Bipolar disorder, unspecified: Secondary | ICD-10-CM

## 2024-04-14 NOTE — Progress Notes (Signed)
      [facility] Counselor/Therapist Progress Note  Patient ID: Chase Mcneil, MRN: [id number],    Date: 04/14/2024  Time Spent: 2:13 PM to 3:07 PM  Treatment Type: Family with patient  Patient father presented with patient to session.  Reported Symptoms: Low motivation, substance use concerns, career concerns, phase of life concerns, anxiousness, stress, socially isolating tendencies  Mental Status Exam:  Appearance:   Neat     Behavior:  Appropriate and Sharing  Motor:  Normal  Speech/Language:   Clear and Coherent and Normal Rate  Affect:  Appropriate and Congruent  Mood:  normal  Thought process:  normal  Thought content:    WNL  Sensory/Perceptual disturbances:    WNL  Orientation:  oriented to person, place, time/date, and situation  Attention:  Good  Concentration:  Good  Memory:  WNL  Fund of knowledge:   Good  Insight:    Good  Judgment:   Good  Impulse Control:  Good   Risk Assessment: Danger to Self:  No Self-injurious Behavior: No Danger to Others: No Duty to Warn:no Physical Aggression / Violence:No  Access to Firearms a concern: No  Gang Involvement:No   Subjective: Patient presented to session to address concerns of bipolar disorder and anxiety.  He reported minimal progress at this time.  Patient presented to session with his father.  He reported no progress in trying to obtain work.  Father expressed concern.  Counselor reinforced recommendations from prior sessions including resourcing temp agencies, [name] works, and having father help with process of finding a job, with a collaborative deadline for employment in mind.  Patient identified stress, anxiety and nausea when parents lecture regarding their expectations.  Counselor provided psychoeducation regarding intrinsic versus extrinsic motivation.  Counselor encouraged follow-up with gastroenterologist as planned, and provided psychoeducation regarding marijuana use and potential abdominal distress.   Counselor assisted father and son in discussion of expectations and patient contribution to family support efforts.  They discussed gym going efforts together, and attending a job fair with attitude of exploration.  Counselor recommended DBT group therapy as addition to patient therapeutic efforts and as opportunity for social outlet.  Interventions: Motivational Interviewing, Solution-Oriented/Positive Psychology, Humanistic/Existential, Insight-Oriented, and Resourcing  Diagnosis:   ICD-10-CM   1. Bipolar I disorder (HCC)  F31.9     2. Generalized anxiety disorder  F41.1       Plan: Patient is scheduled for follow-up; continue process work and developing coping skills.  Patient short-term goal between sessions to attend job fair with father to explore career opportunities, consider DBT group therapy, prioritize gym going outing with father, follow-up with gastroenterologist, continue to work on obtaining employment with fathers support.  Chase Mcneil, [facility]

## 2024-04-17 NOTE — Progress Notes (Signed)
 Ellouise Console, PA-C [address] [place], [place]  [address] Phone: [phone]   Primary Care Physician: Onita Rush, MD  Primary Gastroenterologist:  Ellouise Console, PA-C / Rush Kiang, MD   Chief Complaint: Follow-up IBS-D       HPI:   Discussed the use of AI scribe software for clinical note transcription with the patient, who gave verbal consent to proceed.  History of Present Illness Chase Mcneil is a 28 year old male who presents with worsening diarrhea.  He last saw Dr. 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  Was treated with metronidazole  and Citrucel in the past with benefit.  He has tried Imodium which causes constipation.  He is here today with his mother who helps with his care.  He has been experiencing worsening diarrhea over the past few months, initially triggered by fast food consumption, but now also occurring with home-cooked meals such as peanut butter and jelly sandwiches or bread with jam. His stools are mostly loose or watery, with a frequency of one to three bowel movements per day. No blood in the stool is noted.  He has tried over-the-counter medications like Pepto-Bismol and Imodium. Imodium helps but results in constipation, which he finds undesirable. He has not been on any recent antibiotics and has not traveled recently. He sometimes experiences lower abdominal cramping that resolves after a bowel movement. No significant weight loss is reported, but he fears eating due to the immediate need to use the bathroom after meals.  In 2018, he underwent a colonoscopy which was normal, with no evidence of inflammatory bowel disease or microscopic colitis. He has not had any recent stool tests. No recent changes in medication, vomiting, fever, or chills, although he has experienced some nausea. He still has his gallbladder.  His social history includes minimal alcohol consumption, typically a small amount of wine once a month  or less. He drinks milk with cereal but does not report any issues with dairy products. He rarely experiences constipation.  10/2016 colonoscopy by Dr. Kiang: Normal.  Biopsies negative for microscopic colitis and IBD.   PMH: IBS-D, GERD, bipolar disorder, anxiety.  Current Outpatient Medications  Medication Sig Dispense Refill   cariprazine  (VRAYLAR ) 1.5 MG capsule Take 1 capsule (1.5 mg total) by mouth daily. 30 capsule 5   multivitamin (ONE-A-DAY MEN'S) TABS tablet Take 1 tablet by mouth daily.     Current Facility-Administered Medications  Medication Dose Route Frequency Provider Last Rate Last Admin   0.9 %  sodium chloride  infusion  500 mL Intravenous Continuous Kiang Rush SAILOR, MD        Allergies as of 04/18/2024   (Not on File)    Past Medical History:  Diagnosis Date   GERD (gastroesophageal reflux disease)    Rosacea keratitis     Past Surgical History:  Procedure Laterality Date   ESOPHAGOGASTRODUODENOSCOPY N/A 12/29/2019   Procedure: ESOPHAGOGASTRODUODENOSCOPY (EGD);  Surgeon: Paola Dreama SAILOR, MD;  Location: [facility] ENDOSCOPY;  Service: General;  Laterality: N/A;   TONSILLECTOMY AND ADENOIDECTOMY      Review of Systems:    All systems reviewed and negative except where noted in HPI.    Physical Exam:  BP 110/68   Pulse 75   Ht 5' 11 (1.803 m)   Wt 206 lb 4 oz (93.6 kg)   BMI 28.77 kg/m  No LMP for male patient.  General: Well-nourished, well-developed in no acute distress.  Lungs: Clear to  auscultation bilaterally. Non-labored. Heart: Regular rate and rhythm, no murmurs rubs or gallops.  Abdomen: Bowel sounds are normal; Abdomen is Soft; No hepatosplenomegaly, masses or hernias;  No Abdominal Tenderness; No guarding or rebound tenderness. Neuro: Alert and oriented x 3.  Grossly intact.  Psych: Alert and cooperative, normal mood and affect.  Physical Exam CHEST: Lungs clear to auscultation bilaterally. CARDIOVASCULAR: Heart sounds normal. ABDOMEN: Mild  tenderness in left lower abdomen.     Imaging Studies: No results found.  Labs: CBC    Component Value Date/Time   WBC 5.3 04/18/2024 0915   RBC 5.37 04/18/2024 0915   HGB 16.8 04/18/2024 0915   HCT 49.5 04/18/2024 0915   PLT 219.0 04/18/2024 0915   MCV 92.2 04/18/2024 0915   MCH 33.0 01/02/2020 0400   MCHC 33.9 04/18/2024 0915   RDW 13.1 04/18/2024 0915   LYMPHSABS 1.5 04/18/2024 0915   MONOABS 0.4 04/18/2024 0915   EOSABS 0.2 04/18/2024 0915   BASOSABS 0.0 04/18/2024 0915    CMP     Component Value Date/Time   NA 139 04/18/2024 0915   K 4.1 04/18/2024 0915   CL 99 04/18/2024 0915   CO2 30 04/18/2024 0915   GLUCOSE 95 04/18/2024 0915   BUN 12 04/18/2024 0915   CREATININE 0.96 04/18/2024 0915   CALCIUM 9.8 04/18/2024 0915   PROT 7.4 04/18/2024 0915   ALBUMIN 4.9 04/18/2024 0915   AST 26 04/18/2024 0915   ALT 51 04/18/2024 0915   ALKPHOS 33 (L) 04/18/2024 0915   BILITOT 0.9 04/18/2024 0915   GFRNONAA >60 01/01/2020 2310   GFRAA >60 01/01/2020 2310       Assessment and Plan:   Chase Mcneil is a 28 y.o. y/o male returns for follow-up of chronic diarrhea.  Most likely IBS-D. Assessment & Plan Chronic diarrhea and lower abdominal cramping, suspected irritable bowel syndrome with diarrhea. He has no alarm symptoms such as blood in stool, no recent antibiotics, no weight loss. Normal 2018 colonoscopy excludes inflammatory bowel disease and microscopic colitis. Differential includes IBS-D, celiac disease, pancreatic insufficiency, infections. IBS-D likely due to exclusion and treatment response. - Order stool test for fecal calprotectin. - Order blood work for celiac disease and other causes. - Schedule follow-up in 4-6 weeks to review results and adjust treatment. - Advised no alcohol if Viberzi prescribed.  Plan: - Stool Studies: GI Pathogen Panal, Fecal Calprotectin, Fecal Pancreatic Elastase - Labs: CBC, CMP, CRP, Celiac Screen - If above results are  normal, then plan to try Viberzi for IBS-D.  He and his mother are instructed not to drink alcohol while on Viberzi due to increased risk of pancreatitis.  Xifaxan , hyoscyamine, dicyclomine , and FiberCon are other possible alternative treatments.    Ellouise Console, PA-C  Follow up 6 weeks

## 2024-04-18 ENCOUNTER — Ambulatory Visit (INDEPENDENT_AMBULATORY_CARE_PROVIDER_SITE_OTHER): Payer: Self-pay | Admitting: Physician Assistant

## 2024-04-18 ENCOUNTER — Encounter: Payer: Self-pay | Admitting: Physician Assistant

## 2024-04-18 ENCOUNTER — Other Ambulatory Visit (INDEPENDENT_AMBULATORY_CARE_PROVIDER_SITE_OTHER)

## 2024-04-18 VITALS — BP 110/68 | HR 75 | Ht 71.0 in | Wt 206.2 lb

## 2024-04-18 DIAGNOSIS — R1030 Lower abdominal pain, unspecified: Secondary | ICD-10-CM | POA: Diagnosis not present

## 2024-04-18 DIAGNOSIS — R109 Unspecified abdominal pain: Secondary | ICD-10-CM | POA: Diagnosis not present

## 2024-04-18 DIAGNOSIS — R197 Diarrhea, unspecified: Secondary | ICD-10-CM

## 2024-04-18 DIAGNOSIS — K580 Irritable bowel syndrome with diarrhea: Secondary | ICD-10-CM

## 2024-04-18 LAB — C-REACTIVE PROTEIN: CRP: <0.5 mg/dL (ref 0.5–20.0)

## 2024-04-18 LAB — CBC WITH DIFFERENTIAL/PLATELET
Basophils Absolute: 0 K/uL (ref 0.0–0.1)
Basophils Relative: 0.4 % (ref 0.0–3.0)
Eosinophils Absolute: 0.2 K/uL (ref 0.0–0.7)
Eosinophils Relative: 3 % (ref 0.0–5.0)
HCT: 49.5 % (ref 39.0–52.0)
Hemoglobin: 16.8 g/dL (ref 13.0–17.0)
Lymphocytes Relative: 28.8 % (ref 12.0–46.0)
Lymphs Abs: 1.5 K/uL (ref 0.7–4.0)
MCHC: 33.9 g/dL (ref 30.0–36.0)
MCV: 92.2 fl (ref 78.0–100.0)
Monocytes Absolute: 0.4 K/uL (ref 0.1–1.0)
Monocytes Relative: 7.9 % (ref 3.0–12.0)
Neutro Abs: 3.2 K/uL (ref 1.4–7.7)
Neutrophils Relative %: 59.9 % (ref 43.0–77.0)
Platelets: 219 K/uL (ref 150.0–400.0)
RBC: 5.37 Mil/uL (ref 4.22–5.81)
RDW: 13.1 % (ref 11.5–15.5)
WBC: 5.3 K/uL (ref 4.0–10.5)

## 2024-04-18 LAB — COMPREHENSIVE METABOLIC PANEL WITH GFR
ALT: 51 U/L (ref 0–53)
AST: 26 U/L (ref 0–37)
Albumin: 4.9 g/dL (ref 3.5–5.2)
Alkaline Phosphatase: 33 U/L — ABNORMAL LOW (ref 39–117)
BUN: 12 mg/dL (ref 6–23)
CO2: 30 meq/L (ref 19–32)
Calcium: 9.8 mg/dL (ref 8.4–10.5)
Chloride: 99 meq/L (ref 96–112)
Creatinine, Ser: 0.96 mg/dL (ref 0.40–1.50)
GFR: 106.49 mL/min (ref >60.00)
Glucose, Bld: 95 mg/dL (ref 70–99)
Potassium: 4.1 meq/L (ref 3.5–5.1)
Sodium: 139 meq/L (ref 135–145)
Total Bilirubin: 0.9 mg/dL (ref 0.2–1.2)
Total Protein: 7.4 g/dL (ref 6.0–8.3)

## 2024-04-18 NOTE — Patient Instructions (Signed)
 Your provider has requested that you go to the basement level for lab work before leaving today. Press B on the elevator. The lab is located at the first door on the left as you exit the elevator.  Please follow up sooner if symptoms increase or worsen  Due to recent changes in healthcare laws, you may see the results of your imaging and laboratory studies on MyChart before your provider has had a chance to review them.  We understand that in some cases there may be results that are confusing or concerning to you. Not all laboratory results come back in the same time frame and the provider may be waiting for multiple results in order to interpret others.  Please give us  48 hours in order for your provider to thoroughly review all the results before contacting the office for clarification of your results.   Thank you for trusting me with your gastrointestinal care!   Ellouise Console, PA-C _______________________________________________________  If your blood pressure at your visit was 140/90 or greater, please contact your primary care physician to follow up on this.  _______________________________________________________  If you are age 30 or older, your body mass index should be between 23-30. Your Body mass index is 28.77 kg/m. If this is out of the aforementioned range listed, please consider follow up with your Primary Care Provider.  If you are age 62 or younger, your body mass index should be between 19-25. Your Body mass index is 28.77 kg/m. If this is out of the aformentioned range listed, please consider follow up with your Primary Care Provider.   ________________________________________________________  The [facility] providers would like to encourage you to use MYCHART to communicate with providers for non-urgent requests or questions.  Due to long hold times on the telephone, sending your provider a message by [facility] may be a faster and more efficient way to get a response.   Please allow 48 business hours for a response.  Please remember that this is for non-urgent requests.  _______________________________________________________

## 2024-04-18 NOTE — Progress Notes (Signed)
 Noted

## 2024-04-22 LAB — CELIAC DISEASE AB SCREEN W/RFX
Antigliadin Abs, IgA: 3 U (ref 0–19)
IgA/Immunoglobulin A, Serum: 142 mg/dL (ref 90–386)
Transglutaminase IgA: 2 U/mL (ref 0–3)

## 2024-04-23 ENCOUNTER — Other Ambulatory Visit (INDEPENDENT_AMBULATORY_CARE_PROVIDER_SITE_OTHER)

## 2024-04-23 DIAGNOSIS — R109 Unspecified abdominal pain: Secondary | ICD-10-CM

## 2024-04-23 DIAGNOSIS — R197 Diarrhea, unspecified: Secondary | ICD-10-CM

## 2024-04-24 ENCOUNTER — Ambulatory Visit: Payer: Self-pay | Admitting: Physician Assistant

## 2024-04-24 DIAGNOSIS — K580 Irritable bowel syndrome with diarrhea: Secondary | ICD-10-CM

## 2024-04-24 MED ORDER — VIBERZI 75 MG PO TABS: 75.0000 mg | ORAL_TABLET | Freq: Two times a day (BID) | ORAL | 3 refills | Status: DC

## 2024-04-25 ENCOUNTER — Telehealth: Payer: Self-pay

## 2024-04-25 ENCOUNTER — Telehealth: Payer: Self-pay | Admitting: Behavioral Health

## 2024-04-25 LAB — GI PROFILE, STOOL, PCR

## 2024-04-25 LAB — CALPROTECTIN, FECAL: Calprotectin, Fecal: 28 ug/g (ref 0–120)

## 2024-04-25 NOTE — Telephone Encounter (Signed)
 Pt said he normally takes Xanax  for flights and is going to [place] next Thursday & wants to see if [facility] can write a script for it. He needs enough for the flight there and back.

## 2024-04-25 NOTE — Telephone Encounter (Signed)
 Please see labs GI pathogen panel positive for Norovirus

## 2024-04-25 NOTE — Progress Notes (Signed)
 Notify patient 1.  GI pathogen panel is positive for norovirus.  This will resolve on its own.  Recommend symptomatic treatment and rest.  Drink 64 ounces of fluids daily to prevent dehydration.  To eat BRAT diet as tolerated.  Recommend frequent handwashing and sanitize surfaces to prevent spreading of virus by direct contact. 2.  Fecal calprotectin is normal.  No evidence of inflammatory bowel disease. Ellouise Console, PA-C

## 2024-04-28 ENCOUNTER — Other Ambulatory Visit: Payer: Self-pay

## 2024-04-28 DIAGNOSIS — F411 Generalized anxiety disorder: Secondary | ICD-10-CM

## 2024-04-28 MED ORDER — ALPRAZOLAM 0.5 MG PO TABS: 0.5000 mg | ORAL_TABLET | Freq: Two times a day (BID) | ORAL | 0 refills | Status: AC | PRN

## 2024-04-28 NOTE — Telephone Encounter (Signed)
 Pended

## 2024-04-30 ENCOUNTER — Other Ambulatory Visit: Payer: Self-pay

## 2024-04-30 DIAGNOSIS — K580 Irritable bowel syndrome with diarrhea: Secondary | ICD-10-CM

## 2024-04-30 MED ORDER — VIBERZI 75 MG PO TABS: 75.0000 mg | ORAL_TABLET | Freq: Two times a day (BID) | ORAL | 3 refills | Status: DC

## 2024-04-30 NOTE — Telephone Encounter (Signed)
Prescription called to [facility].

## 2024-04-30 NOTE — Telephone Encounter (Signed)
Dr. Perry pt 

## 2024-04-30 NOTE — Telephone Encounter (Signed)
 Patient mother calling states pharmacy has not received viberzi medications. Please advise.

## 2024-04-30 NOTE — Telephone Encounter (Signed)
 [facility]

## 2024-05-01 ENCOUNTER — Ambulatory Visit: Admitting: Professional Counselor

## 2024-05-01 ENCOUNTER — Other Ambulatory Visit: Payer: Self-pay

## 2024-05-01 MED ORDER — VIBERZI 75 MG PO TABS: 75.0000 mg | ORAL_TABLET | Freq: Two times a day (BID) | ORAL | 3 refills | Status: AC

## 2024-05-01 NOTE — Telephone Encounter (Signed)
 Inbound call from patient's mother stating [facility] not [facility]. [facility] [address] [facility]. Requesting to know if samples are available. States they are due to leave at 12 on a airplane and patient is having diarrhea. Requesting a call at [phone]. Please advise, thank you

## 2024-05-01 NOTE — Telephone Encounter (Signed)
 [facility] [address]. Samples left up front for pt to pickup. Pts father aware.

## 2024-05-15 ENCOUNTER — Ambulatory Visit (INDEPENDENT_AMBULATORY_CARE_PROVIDER_SITE_OTHER): Admitting: Professional Counselor

## 2024-05-15 ENCOUNTER — Encounter: Payer: Self-pay | Admitting: Professional Counselor

## 2024-05-15 DIAGNOSIS — F319 Bipolar disorder, unspecified: Secondary | ICD-10-CM | POA: Diagnosis not present

## 2024-05-15 DIAGNOSIS — F411 Generalized anxiety disorder: Secondary | ICD-10-CM | POA: Diagnosis not present

## 2024-05-15 NOTE — Progress Notes (Signed)
      [facility] Counselor/Therapist Progress Note  Patient ID: Chase Mcneil, MRN: [id number],    Date: 05/28/2024  Time Spent: 3:05 PM to 4:07 PM  Treatment Type: Individual Therapy  Reported Symptoms: Low motivation, stress, anxiousness, socially isolating tendencies, social anxiety, fatigue, substance use concerns, phase of life concerns, interpersonal concerns, physiological response to anxiousness including sweaty palms  Mental Status Exam:  Appearance:   Casual     Behavior:  Appropriate and Sharing  Motor:  Normal  Speech/Language:   Clear and Coherent and Normal Rate  Affect:  Appropriate and Congruent  Mood:  normal  Thought process:  normal  Thought content:    WNL  Sensory/Perceptual disturbances:    WNL  Orientation:  oriented to person, place, time/date, and situation  Attention:  Good  Concentration:  Good  Memory:  WNL  Fund of knowledge:   Good  Insight:    Good  Judgment:   Good  Impulse Control:  Good   Risk Assessment: Danger to Self:  No Self-injurious Behavior: No Danger to Others: No Duty to Warn:no Physical Aggression / Violence:No  Access to Firearms a concern: No  Gang Involvement:No   Subjective: Patient presented to session to address concerns of bipolar disorder and anxiety.  He reported mixed progress at this time.  He processed experience of having been on a trip out of state with his mom and having had a good time, and getting his [place] citizenship.  He also reported having gotten a car, and to be still working on getting a job.  Counselor reinforced patient positivity and progress.  Counselor and patient discussed patient motivational concerns, and counselor assisted patient in developing CBT and DBT skill set including cognitive restructuring techniques, mindfulness, relaxation coping strategies including grounding and breath work, and ACT mindfulness meditation leaves on a stream.  Counselor also facilitated an expressive arts therapy  activity with patient in which patient drew a past and future hand and counselor assisted patient in identifying what he wants to let go of including bad memories, comparative thinking, expectations, anxiety, unemployment, social anxiety and low motivation, and things he wishes for in future to be financial freedom, autonomy, improved relationship with parents, inspiration, interpersonal effectiveness, new positive memories, creative people in his life, employment and success.  Counselor utilized motivational interviewing to assist patient in identifying where marijuana use falls on the hands, to which patient identified needing a third hand due to mixed feelings about usage.  He expressed considering a break, wondering if he would be more successful without use, and counselor helped patient sort thoughts and feelings around the issue, and provided psychoeducation.  Patient explored the pros and cons of marijuana use, and counselor and patient discussed.  Interventions: Cognitive Behavioral Therapy, Dialectical Behavioral Therapy, Motivational Interviewing, Solution-Oriented/Positive Psychology, Humanistic/Existential, Insight-Oriented, and Expressive Arts Therapy, ACT  Diagnosis:   ICD-10-CM   1. Bipolar I disorder (HCC)  F31.9     2. Generalized anxiety disorder  F41.1       Plan: Patient is scheduled for a follow-up; continue process work and developing coping skills.  Patient short-term goal between sessions to reflect on lists made in session and impact on his life trajectory and wellbeing regarding choices past and present.  Patient to approach reflection with open mindedness and self compassion, limiting self-critical mindset.  [name], [facility]

## 2024-05-28 ENCOUNTER — Encounter: Payer: Self-pay | Admitting: Professional Counselor

## 2024-05-28 ENCOUNTER — Ambulatory Visit: Admitting: Professional Counselor

## 2024-05-28 DIAGNOSIS — F319 Bipolar disorder, unspecified: Secondary | ICD-10-CM

## 2024-05-28 DIAGNOSIS — F411 Generalized anxiety disorder: Secondary | ICD-10-CM
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
## 2024-05-28 NOTE — Progress Notes (Deleted)
      [facility] Counselor/Therapist Progress Note  Patient ID: Chase Mcneil, MRN: [id number],    Date: 05/28/2024  Time Spent: ***   Treatment Type: Individual Therapy  Reported Symptoms: ***  Mental Status Exam:  Appearance:   Neat     Behavior:  Appropriate and Sharing  Motor:  Normal  Speech/Language:   Clear and Coherent and Normal Rate  Affect:  Appropriate and Congruent  Mood:  normal  Thought process:  normal  Thought content:    WNL  Sensory/Perceptual disturbances:    WNL  Orientation:  oriented to person, place, time/date, and situation  Attention:  Good  Concentration:  Good  Memory:  WNL  Fund of knowledge:   Good  Insight:    Good  Judgment:   Good  Impulse Control:  Good   Risk Assessment: Danger to Self:  No Self-injurious Behavior: No Danger to Others: No Duty to Warn:no Physical Aggression / Violence:No  Access to Firearms a concern: No  Gang Involvement:No   Subjective: ***   Interventions: Solution-Oriented/Positive Psychology, Humanistic/Existential, Insight-Oriented, and Assessment   Diagnosis:   ICD-10-CM   1. Bipolar I disorder (HCC)  F31.9     2. Generalized anxiety disorder  F41.1       Plan: ***  Chase Mcneil, [facility]

## 2024-06-03 NOTE — Progress Notes (Deleted)
 Ellouise Console, PA-C [address] [place], [place]  [address] Phone: [phone]   Primary Care Physician: Onita Rush, MD  Primary Gastroenterologist:  Ellouise Console, PA-C / Rush Kiang, MD   Chief Complaint: Follow-up diarrhea       HPI:   Discussed the use of AI scribe software for clinical note transcription with the patient, who gave verbal consent to proceed.  I last saw patient 04/18/2024 to evaluate diarrhea.  Has history of irritable bowel syndrome.  He was started on Viberzi  75 mg 1 tablet daily for IBS-D.  04/23/2024 stool test: GI pathogen panel positive for norovirus.  Negative for all other infections.  Fecal calprotectin normal.  Pancreatic elastase not able to be tested.  04/18/24 labs: Celiac negative.  Normal CBC, CMP, CRP.  Hgb 16.8.  10/2016 colonoscopy by Dr. Kiang: Normal.  Biopsies negative for microscopic colitis and IBD.   PMH: IBS-D, GERD, bipolar disorder, anxiety.  History of Present Illness      Current Outpatient Medications  Medication Sig Dispense Refill   Eluxadoline  (VIBERZI ) 75 MG TABS Take 1 tablet (75 mg total) by mouth 2 (two) times daily. 60 tablet 3   ALPRAZolam  (XANAX ) 0.5 MG tablet Take 1 tablet (0.5 mg total) by mouth 2 (two) times daily as needed for anxiety. 10 tablet 0   cariprazine  (VRAYLAR ) 1.5 MG capsule Take 1 capsule (1.5 mg total) by mouth daily. 30 capsule 5   multivitamin (ONE-A-DAY MEN'S) TABS tablet Take 1 tablet by mouth daily.     Current Facility-Administered Medications  Medication Dose Route Frequency Provider Last Rate Last Admin   0.9 %  sodium chloride  infusion  500 mL Intravenous Continuous Kiang Rush SAILOR, MD        Allergies as of 06/04/2024   (Not on File)    Past Medical History:  Diagnosis Date   GERD (gastroesophageal reflux disease)    Rosacea keratitis     Past Surgical History:  Procedure Laterality Date   ESOPHAGOGASTRODUODENOSCOPY N/A 12/29/2019   Procedure:  ESOPHAGOGASTRODUODENOSCOPY (EGD);  Surgeon: Paola Dreama SAILOR, MD;  Location: [facility] ENDOSCOPY;  Service: General;  Laterality: N/A;   TONSILLECTOMY AND ADENOIDECTOMY      Review of Systems:    All systems reviewed and negative except where noted in HPI.    Physical Exam:  There were no vitals taken for this visit. No LMP for male patient.  General: Well-nourished, well-developed in no acute distress.  Lungs: Clear to auscultation bilaterally. Non-labored. Heart: Regular rate and rhythm, no murmurs rubs or gallops.  Abdomen: Bowel sounds are normal; Abdomen is Soft; No hepatosplenomegaly, masses or hernias;  No Abdominal Tenderness; No guarding or rebound tenderness. Neuro: Alert and oriented x 3.  Grossly intact.  Psych: Alert and cooperative, normal mood and affect.   Imaging Studies: No results found.  Labs: CBC    Component Value Date/Time   WBC 5.3 04/18/2024 0915   RBC 5.37 04/18/2024 0915   HGB 16.8 04/18/2024 0915   HCT 49.5 04/18/2024 0915   PLT 219.0 04/18/2024 0915   MCV 92.2 04/18/2024 0915   MCH 33.0 01/02/2020 0400   MCHC 33.9 04/18/2024 0915   RDW 13.1 04/18/2024 0915   LYMPHSABS 1.5 04/18/2024 0915   MONOABS 0.4 04/18/2024 0915   EOSABS 0.2 04/18/2024 0915   BASOSABS 0.0 04/18/2024 0915    CMP     Component Value Date/Time   NA 139 04/18/2024 0915   K 4.1 04/18/2024 0915  CL 99 04/18/2024 0915   CO2 30 04/18/2024 0915   GLUCOSE 95 04/18/2024 0915   BUN 12 04/18/2024 0915   CREATININE 0.96 04/18/2024 0915   CALCIUM 9.8 04/18/2024 0915   PROT 7.4 04/18/2024 0915   ALBUMIN 4.9 04/18/2024 0915   AST 26 04/18/2024 0915   ALT 51 04/18/2024 0915   ALKPHOS 33 (L) 04/18/2024 0915   BILITOT 0.9 04/18/2024 0915   GFRNONAA >60 01/01/2020 2310   GFRAA >60 01/01/2020 2310       Assessment and Plan:   EFE FAZZINO is a 28 y.o. y/o male returns for follow-up of chronic diarrhea attributed to irritable bowel syndrome.  Recent stool test positive for  norovirus, but negative for all other infectious pathogens.  Normal fecal calprotectin.  No evidence of IBD.  Negative celiac labs.  Normal CBC, CMP, CRP.  Colonoscopy in 2018 was negative for microscopic colitis and IBD.  1.  Irritable bowel syndrome, diarrhea predominant - Continue Viberzi  75 mg 1 tablet daily.  Assessment and Plan Assessment & Plan       Ellouise Console, PA-C  Follow up ***

## 2024-06-04 ENCOUNTER — Ambulatory Visit: Admitting: Physician Assistant

## 2024-06-11 ENCOUNTER — Ambulatory Visit: Admitting: Professional Counselor

## 2024-06-17 ENCOUNTER — Ambulatory Visit (INDEPENDENT_AMBULATORY_CARE_PROVIDER_SITE_OTHER): Admitting: Otolaryngology

## 2024-06-17 ENCOUNTER — Encounter (INDEPENDENT_AMBULATORY_CARE_PROVIDER_SITE_OTHER): Payer: Self-pay | Admitting: Otolaryngology

## 2024-06-17 VITALS — BP 129/84 | HR 100 | Temp 97.3°F | Ht 71.0 in | Wt 195.0 lb

## 2024-06-17 DIAGNOSIS — H900 Conductive hearing loss, bilateral: Secondary | ICD-10-CM | POA: Insufficient documentation

## 2024-06-17 DIAGNOSIS — H6123 Impacted cerumen, bilateral: Secondary | ICD-10-CM | POA: Insufficient documentation
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
## 2024-06-18 NOTE — Progress Notes (Unsigned)
 Chase Console, PA-C [address] [place], [place]  [address] Phone: [phone]   Primary Care Physician: Onita Rush, MD  Primary Gastroenterologist:  Chase Console, PA-C / Rush Kiang, MD   Chief Complaint:  F/U IBS-D       HPI:   Discussed the use of AI scribe software for clinical note transcription with the patient, who gave verbal consent to proceed.  He returns for 6-week follow-up of diarrhea, thought due to IBS-D.  Labs and stool studies unrevealing.  He was started on Viberzi  75 mg twice daily at his last visit.  Patient states he never started the Viberzi .  He was concerned about adverse side effects that he read about.  Currently he is taking Imodium 1 tablet daily in the morning which is controlling his diarrhea.  He is feeling better.  He also tries to eat Mediterranean diet which his mom prepares.  Trigger foods include eating out fast food.  He is here today with his mother.  Overall stable and improved.  04/2024 labs: Negative celiac labs.  Normal CBC, CMP, CRP.  04/2024 stool studies: Normal fecal calprotectin.  GI pathogen panel positive for norovirus, negative for all other infections.  Fecal pancreatic elastase not performed.  History of Present Illness Diarrhea and bowel habits - Diarrhea attributed to irritable bowel syndrome - Imodium 1 tablet daily in the morning effectively controls symptoms until the next day - Certain foods, especially fast foods such as hibachi, cookout, and [employer], trigger diarrhea even with Imodium - Home-cooked Mediterranean meals do not cause gastrointestinal symptoms - Occasional use of Pepto-Bismol - No mention of associated abdominal pain, blood in stool, or weight loss  Dietary modifications - Avoids fast food to prevent diarrhea - Primarily consumes home-cooked Mediterranean diet, which does not trigger symptoms  Medication intolerance and preferences - Previously prescribed Viberzi  but declined due to concerns  about side effects - Prefers to use Imodium for symptom control  Infectious workup - Stool tests showed normal fecal calprotectin - GI pathogen panel positive for norovirus; other infectious etiologies negative - Recent stool tests negative for most infections except norovirus  Liver function - Slightly elevated liver test in 2015 - Recent liver ultrasound in May showed mild hepatic steatosis.   - Blood work in October 2025 showed normal liver function.  Recent travel - Recent travel to [place]  for dual citizenship purposes   10/2016 colonoscopy by Dr. Kiang: Normal.  Biopsies negative for microscopic colitis and IBD.   PMH: IBS-D, GERD, bipolar disorder, anxiety.  Current Outpatient Medications  Medication Sig Dispense Refill   cariprazine  (VRAYLAR ) 1.5 MG capsule Take 1 capsule (1.5 mg total) by mouth daily. 30 capsule 5   loperamide (IMODIUM A-D) 2 MG tablet Take 2 mg by mouth daily.     multivitamin (ONE-A-DAY MEN'S) TABS tablet Take 1 tablet by mouth daily.     ALPRAZolam  (XANAX ) 0.5 MG tablet Take 1 tablet (0.5 mg total) by mouth 2 (two) times daily as needed for anxiety. (Patient not taking: Reported on 06/19/2024) 10 tablet 0   Eluxadoline  (VIBERZI ) 75 MG TABS Take 1 tablet (75 mg total) by mouth 2 (two) times daily. (Patient not taking: Reported on 06/19/2024) 60 tablet 3   Current Facility-Administered Medications  Medication Dose Route Frequency Provider Last Rate Last Admin   0.9 %  sodium chloride  infusion  500 mL Intravenous Continuous Kiang Rush SAILOR, MD        Allergies as of 06/19/2024   (  No Known Allergies)    Past Medical History:  Diagnosis Date   GERD (gastroesophageal reflux disease)    Rosacea keratitis     Past Surgical History:  Procedure Laterality Date   ESOPHAGOGASTRODUODENOSCOPY N/A 12/29/2019   Procedure: ESOPHAGOGASTRODUODENOSCOPY (EGD);  Surgeon: Paola Dreama SAILOR, MD;  Location: [facility] ENDOSCOPY;  Service: General;  Laterality: N/A;    TONSILLECTOMY AND ADENOIDECTOMY      Review of Systems:    All systems reviewed and negative except where noted in HPI.    Physical Exam:  BP 110/84 (BP Location: Left Arm, Patient Position: Sitting, Cuff Size: Large)   Pulse 84   Ht 5' 10.5 (1.791 m)   Wt 196 lb (88.9 kg)   BMI 27.73 kg/m  No LMP for male patient.  General: Well-nourished, well-developed in no acute distress.  Neuro: Alert and oriented x 3.  Grossly intact.  Psych: Alert and cooperative, normal mood and affect.   Imaging Studies: No results found.  Labs: CBC    Component Value Date/Time   WBC 5.3 04/18/2024 0915   RBC 5.37 04/18/2024 0915   HGB 16.8 04/18/2024 0915   HCT 49.5 04/18/2024 0915   PLT 219.0 04/18/2024 0915   MCV 92.2 04/18/2024 0915   MCH 33.0 01/02/2020 0400   MCHC 33.9 04/18/2024 0915   RDW 13.1 04/18/2024 0915   LYMPHSABS 1.5 04/18/2024 0915   MONOABS 0.4 04/18/2024 0915   EOSABS 0.2 04/18/2024 0915   BASOSABS 0.0 04/18/2024 0915    CMP     Component Value Date/Time   NA 139 04/18/2024 0915   K 4.1 04/18/2024 0915   CL 99 04/18/2024 0915   CO2 30 04/18/2024 0915   GLUCOSE 95 04/18/2024 0915   BUN 12 04/18/2024 0915   CREATININE 0.96 04/18/2024 0915   CALCIUM 9.8 04/18/2024 0915   PROT 7.4 04/18/2024 0915   ALBUMIN 4.9 04/18/2024 0915   AST 26 04/18/2024 0915   ALT 51 04/18/2024 0915   ALKPHOS 33 (L) 04/18/2024 0915   BILITOT 0.9 04/18/2024 0915   GFRNONAA >60 01/01/2020 2310   GFRAA >60 01/01/2020 2310       Assessment and Plan:   Chase Mcneil is a 28 y.o. y/o male presents for follow-up of: Assessment & Plan 1.  Irritable bowel syndrome with diarrhea Chronic diarrhea attributed to IBS. Imodium effective. Stool tests normal; no IBD or infections.  GI symptoms are stable and improved and controlled on low-dose Imodium.  He did not try Viberzi  due to fear of adverse side effects. - Pepto-Bismol not advised due to risk of black stools. - Continue Imodium as  needed, up to eight tablets per day. - Maintain Mediterranean diet, avoiding fast food and processed foods.  2.  Hepatic steatosis (fatty liver) Mild fatty liver confirmed by ultrasound. Normal ALT levels despite past elevation. Family history of diabetes and hyperlipidemia. Managed with dietary changes. - Continue low-fat, low-carbohydrate diet, such as the Mediterranean diet. - Recommend regular exercise. - Monitor LFTs through PCP annually.   Chase Console, PA-C  Follow up as needed if recurrent or worsening GI symptoms.

## 2024-06-19 ENCOUNTER — Encounter: Payer: Self-pay | Admitting: Physician Assistant

## 2024-06-19 ENCOUNTER — Ambulatory Visit: Admitting: Physician Assistant

## 2024-06-19 VITALS — BP 110/84 | HR 84 | Ht 70.5 in | Wt 196.0 lb

## 2024-06-19 DIAGNOSIS — K760 Fatty (change of) liver, not elsewhere classified: Secondary | ICD-10-CM

## 2024-06-19 DIAGNOSIS — K580 Irritable bowel syndrome with diarrhea: Secondary | ICD-10-CM

## 2024-06-19 NOTE — Patient Instructions (Addendum)
 Continue Imodium as needed to help control diarrhea. Great to see you today. I'm glad you are feeling better. Follow-up as needed. Ellouise Console, PA-C   Please follow up sooner if symptoms increase or worsen  Due to recent changes in healthcare laws, you may see the results of your imaging and laboratory studies on MyChart before your provider has had a chance to review them.  We understand that in some cases there may be results that are confusing or concerning to you. Not all laboratory results come back in the same time frame and the provider may be waiting for multiple results in order to interpret others.  Please give us  48 hours in order for your provider to thoroughly review all the results before contacting the office for clarification of your results.   Thank you for trusting me with your gastrointestinal care!  _______________________________________________________  If your blood pressure at your visit was 140/90 or greater, please contact your primary care physician to follow up on this.  _______________________________________________________  If you are age 76 or older, your body mass index should be between 23-30. Your Body mass index is 27.73 kg/m. If this is out of the aforementioned range listed, please consider follow up with your Primary Care Provider.  If you are age 55 or younger, your body mass index should be between 19-25. Your Body mass index is 27.73 kg/m. If this is out of the aformentioned range listed, please consider follow up with your Primary Care Provider.   ________________________________________________________  [facility] providers would like to encourage you to use MYCHART to communicate with providers for non-urgent requests or questions.  Due to long hold times on the telephone, sending your provider a message by [facility] may be a faster and more efficient way to get a response.  Please allow 48 business hours for a response.  Please remember that  this is for non-urgent requests.  _______________________________________________________

## 2024-06-25 NOTE — Progress Notes (Signed)
 Noted

## 2024-07-10 ENCOUNTER — Encounter: Payer: Self-pay | Admitting: Professional Counselor

## 2024-07-10 ENCOUNTER — Ambulatory Visit: Admitting: Professional Counselor

## 2024-07-10 DIAGNOSIS — F411 Generalized anxiety disorder: Secondary | ICD-10-CM | POA: Diagnosis not present

## 2024-07-10 DIAGNOSIS — F319 Bipolar disorder, unspecified: Secondary | ICD-10-CM

## 2024-07-10 NOTE — Progress Notes (Addendum)
"   °      [facility] Counselor/Therapist Progress Note  Patient ID: LYNCOLN LEDGERWOOD, MRN: [id number],    Date: 07/10/2024  Time Spent: 3:15 PM to 4:10 PM  Treatment Type: Individual Therapy  Reported Symptoms: Sadness, loneliness, grief/loss, worries, feelings of emptiness, anxiousness, substance use concerns, phase of life concerns, self-esteem concerns, sleep concerns  Mental Status Exam:  Appearance:   Neat     Behavior:  Appropriate and Sharing  Motor:  Normal  Speech/Language:   Clear and Coherent and Normal Rate  Affect:  Appropriate, Congruent, and Tearful  Mood:  sad  Thought process:  normal  Thought content:    WNL  Sensory/Perceptual disturbances:    WNL  Orientation:  oriented to person, place, time/date, and situation  Attention:  Good  Concentration:  Good  Memory:  WNL  Fund of knowledge:   Good  Insight:    Good  Judgment:   Good  Impulse Control:  Good   Risk Assessment: Danger to Self:  No Self-injurious Behavior: No Danger to Others: No Duty to Warn:no Physical Aggression / Violence:No  Access to Firearms a concern: No  Gang Involvement:No   Subjective: Patient presented to session to address concerns of bipolar disorder and anxiety.  He reported exacerbated depression since last session, attributing sense of sadness and loneliness to place in his life at this time and reflection upon what he wishes it was and could be.  Counselor actively listened and affirmed patient feelings and experience, and help to facilitate insight into patient's concerns and reinforce coping skills.  Patient explored factors contributing to sense of sadness including phase of life concerns, conflicting feelings regarding living at home with desire to be self-sufficient, frustrations regarding regrets of the past and future career possibilities.  He continued to process sense of grief and loss around pending sale of family home.  He identified finding comfort in taking walks by himself  recently, feeling peace and gratitude and enjoying fresh air and time in nature.  He also identified prayer as a helpful coping skill.  Counselor reinforced patient strengths and internal and external resourcing.  Counselor and patient discussed patient experience of dependency on parents at this time, and their deep connection, and his retaining of the connection while also living increasingly into both internal and external individuation.  Counselor facilitated guided meditation and patient practiced relaxation and breath work skills, and identified new years resolution as to being more productive.  Counselor utilized MI to assess stage of readiness in terms of patient's substance use [facility]); patient explored increasing sense of desire and benefit to discontinue THC use.  Interventions: Solution-Oriented/Positive Psychology, Humanistic/Existential, Insight-Oriented, and MBSR  Diagnosis:   ICD-10-CM   1. Bipolar I disorder (HCC)  F31.9     2. Generalized anxiety disorder  F41.1       Plan: Patient is scheduled for follow-up; continue process work and developing coping skills.  Patient short-term goal between sessions to continue to apply to jobs, practice positive affirmations/self-talk, and continue to reflect on other ways in which he might be able to increase productivity and enhance the journey towards self-sufficiency.  [name], [facility]                   "

## 2024-07-24 ENCOUNTER — Encounter: Payer: Self-pay | Admitting: Professional Counselor

## 2024-07-24 ENCOUNTER — Ambulatory Visit (INDEPENDENT_AMBULATORY_CARE_PROVIDER_SITE_OTHER): Admitting: Professional Counselor

## 2024-07-24 DIAGNOSIS — F319 Bipolar disorder, unspecified: Secondary | ICD-10-CM

## 2024-07-24 DIAGNOSIS — F411 Generalized anxiety disorder: Secondary | ICD-10-CM | POA: Diagnosis not present

## 2024-07-24 NOTE — Progress Notes (Signed)
"   °      [facility] Counselor/Therapist Progress Note  Patient ID: Chase Mcneil, MRN: [id number],    Date: 07/24/2024  Time Spent: 3:10 PM to 4:16 PM  Treatment Type: Individual Therapy  Reported Symptoms: tearfulness, sadness, worries, regrets, ruminations, grief/loss, low motivation, low self esteem, sleep concerns, trouble concentrating, anxiousness, low mood, anhedonia, trouble relaxing, substance use concerns, phase of life concerns, career concerns  Mental Status Exam:  Appearance:   Neat     Behavior:  Appropriate and Sharing  Motor:  Normal  Speech/Language:   Clear and Coherent and Normal Rate  Affect:  Tearful  Mood:  depressed and sad  Thought process:  normal  Thought content:    WNL  Sensory/Perceptual disturbances:    WNL  Orientation:  oriented to person, place, time/date, and day of week  Attention:  Good  Concentration:  Good  Memory:  WNL  Fund of knowledge:   Good  Insight:    Good  Judgment:   Good  Impulse Control:  Good   Risk Assessment: Danger to Self:  No Self-injurious Behavior: No Danger to Others: No Duty to Warn:no Physical Aggression / Violence:No  Access to Firearms a concern: No  Gang Involvement:No   Subjective: Patient presented to session to address concerns of bipolar disorder and anxiety.  He reported minimal progress at this time.  Patient presented to session with depressive symptoms.  He processed experience of considerable sense of grief and loss around missed opportunities, missed life paths, and to be experiencing low motivation regarding applying for jobs, and reticence to engage with others socially for risk of emotional pain.  However patient also identified strong need for companionship.  Counselor actively listened, affirmed patient feelings and experience, and helped to facilitate insight into patient concerns, and to develop strategies for improvement.  Counselor inquired regarding patient thoughts regarding a return to school,  with college potentially providing sense of direction, focus, social opportunity, networking, and skill building.  Counselor also helped to instill hope in patient future, and progress, and encouraged patient to take reasonable risks socially.  Counselor utilized MI to reinforce patient harm reduction around substance use.  Interventions: Solution-Oriented/Positive Psychology, Humanistic/Existential, and Insight-Oriented, CBT, MI  Diagnosis:   ICD-10-CM   1. Bipolar I disorder (HCC)  F31.9     2. Generalized anxiety disorder  F41.1       Plan: Patient is scheduled for follow-up; continue process work and developing coping skills.  Patient short-term goal between sessions to consider looking into school opportunities and discussing with parents, consider taking reasonable risks socially, practice cognitive restructuring on automatic negative thoughts, and continue harm reduction per substance use.  Chase Mcneil, [facility]                   "

## 2024-08-07 ENCOUNTER — Encounter: Payer: Self-pay | Admitting: Professional Counselor

## 2024-08-07 ENCOUNTER — Ambulatory Visit: Admitting: Professional Counselor

## 2024-08-07 DIAGNOSIS — F319 Bipolar disorder, unspecified: Secondary | ICD-10-CM

## 2024-08-07 DIAGNOSIS — F411 Generalized anxiety disorder: Secondary | ICD-10-CM

## 2024-08-07 NOTE — Progress Notes (Unsigned)
"   °      [facility] Counselor/Therapist Progress Note  Patient ID: Chase Mcneil, MRN: [id number],    Date: 08/07/2024  Time Spent: ***   Treatment Type: Individual Therapy  Reported Symptoms: tearfulness, low mood, sadness, loneliness, low motivation, anxiousness, social isolating tendencies, phase of life concerns, career concerns worries  Mental Status Exam:  Appearance:   Neat     Behavior:  Appropriate and Sharing  Motor:  Normal  Speech/Language:   Clear and Coherent and Normal Rate  Affect:  Appropriate, Congruent, and Tearful  Mood:  normal  Thought process:  normal  Thought content:    WNL  Sensory/Perceptual disturbances:    WNL  Orientation:  oriented to person, place, time/date, and situation  Attention:  Good  Concentration:  Good  Memory:  WNL  Fund of knowledge:   Good  Insight:    Good  Judgment:   Good  Impulse Control:  Good   Risk Assessment: Danger to Self:  No Self-injurious Behavior: No Danger to Others: No Duty to Warn:no Physical Aggression / Violence:No  Access to Firearms a concern: No  Gang Involvement:No   Subjective: ***   Interventions: Motivational Interviewing, Solution-Oriented/Positive Psychology, Humanistic/Existential, Psycho-education/Bibliotherapy, and Insight-Oriented  Diagnosis:   ICD-10-CM   1. Bipolar I disorder (HCC)  F31.9     2. Generalized anxiety disorder  F41.1       Plan: ***  Chase Mcneil, [facility]                   "

## 2024-08-21 ENCOUNTER — Ambulatory Visit: Admitting: Professional Counselor

## 2024-09-04 ENCOUNTER — Ambulatory Visit: Admitting: Professional Counselor

## 2024-09-15 ENCOUNTER — Ambulatory Visit: Admitting: Behavioral Health

## 2024-09-16 ENCOUNTER — Ambulatory Visit: Admitting: Professional Counselor

## 2024-09-29 ENCOUNTER — Ambulatory Visit: Admitting: Professional Counselor

## 2024-10-13 ENCOUNTER — Ambulatory Visit: Admitting: Professional Counselor

## 2024-10-27 ENCOUNTER — Ambulatory Visit: Admitting: Professional Counselor
